# Patient Record
Sex: Female | Born: 2000 | Race: Black or African American | Hispanic: No | Marital: Single | Smoking: Never smoker
Health system: Southern US, Community
[De-identification: ages and names within clinical notes are randomized; demographics above are authoritative.]

## PROBLEM LIST (undated history)

## (undated) DIAGNOSIS — E119 Type 2 diabetes mellitus without complications: Secondary | ICD-10-CM

---
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
## 2019-05-07 ENCOUNTER — Ambulatory Visit: Payer: PRIVATE HEALTH INSURANCE | Attending: Family

## 2019-05-07 DIAGNOSIS — Z23 Encounter for immunization: Secondary | ICD-10-CM

## 2019-05-07 NOTE — Progress Notes (Signed)
   Covid-19 Vaccination Clinic  Name:  Susan [name]    MRN: [id number] DOB: 05/17/2000  05/07/2019  [name] was observed post Covid-19 immunization for 15 minutes without incident. She was provided with Vaccine Information Sheet and instruction to access the V-Safe system.   [name] was instructed to call 911 with any severe reactions post vaccine: Marland Kitchen Difficulty breathing  . Swelling of face and throat  . A fast heartbeat  . A bad rash all over body  . Dizziness and weakness   Immunizations Administered    Name Date Dose VIS Date Route   Moderna COVID-19 Vaccine 05/07/2019  3:51 PM 0.5 mL 01/20/2019 Intramuscular   Manufacturer: Moderna   Lot: [id number]   NDC: 15183-437-35

## 2019-06-09 ENCOUNTER — Ambulatory Visit: Payer: PRIVATE HEALTH INSURANCE | Attending: Family

## 2019-06-09 DIAGNOSIS — Z23 Encounter for immunization: Secondary | ICD-10-CM

## 2019-06-09 NOTE — Progress Notes (Signed)
   Covid-19 Vaccination Clinic  Name:  Susan [name]    MRN: [id number] DOB: 2001/02/03  06/09/2019  [name] was observed post Covid-19 immunization for 15 minutes without incident. She was provided with Vaccine Information Sheet and instruction to access the V-Safe system.   [name] was instructed to call 911 with any severe reactions post vaccine: Marland Kitchen Difficulty breathing  . Swelling of face and throat  . A fast heartbeat  . A bad rash all over body  . Dizziness and weakness   Immunizations Administered    Name Date Dose VIS Date Route   Moderna COVID-19 Vaccine 06/09/2019  4:07 PM 0.5 mL 01/2019 Intramuscular   Manufacturer: Moderna   Lot: [id number]   NDC: 17356-701-41

## 2020-04-18 ENCOUNTER — Encounter (HOSPITAL_COMMUNITY): Payer: Self-pay

## 2020-04-18 ENCOUNTER — Observation Stay (HOSPITAL_COMMUNITY)
Admission: EM | Admit: 2020-04-18 | Discharge: 2020-04-19 | Disposition: A | Discharge status: Home / Self Care | Payer: Medicaid - Out of State | Attending: Internal Medicine | Admitting: Internal Medicine

## 2020-04-18 ENCOUNTER — Observation Stay (HOSPITAL_COMMUNITY): Payer: Medicaid - Out of State

## 2020-04-18 ENCOUNTER — Other Ambulatory Visit: Payer: Self-pay

## 2020-04-18 DIAGNOSIS — E871 Hypo-osmolality and hyponatremia: Secondary | ICD-10-CM

## 2020-04-18 DIAGNOSIS — E1010 Type 1 diabetes mellitus with ketoacidosis without coma: Principal | ICD-10-CM | POA: Insufficient documentation

## 2020-04-18 DIAGNOSIS — E876 Hypokalemia: Secondary | ICD-10-CM | POA: Diagnosis present

## 2020-04-18 DIAGNOSIS — E1110 Type 2 diabetes mellitus with ketoacidosis without coma: Secondary | ICD-10-CM

## 2020-04-18 DIAGNOSIS — R631 Polydipsia: Secondary | ICD-10-CM | POA: Diagnosis present

## 2020-04-18 DIAGNOSIS — Z20822 Contact with and (suspected) exposure to covid-19: Secondary | ICD-10-CM | POA: Diagnosis not present

## 2020-04-18 DIAGNOSIS — R0602 Shortness of breath: Secondary | ICD-10-CM

## 2020-04-18 DIAGNOSIS — Z794 Long term (current) use of insulin: Secondary | ICD-10-CM | POA: Insufficient documentation

## 2020-04-18 DIAGNOSIS — E1310 Other specified diabetes mellitus with ketoacidosis without coma: Secondary | ICD-10-CM

## 2020-04-18 HISTORY — DX: Type 2 diabetes mellitus with ketoacidosis without coma: E11.10

## 2020-04-18 LAB — BASIC METABOLIC PANEL
Anion gap: 20 — ABNORMAL HIGH (ref 5–15)
Anion gap: 17 — ABNORMAL HIGH (ref 5–15)
Anion gap: 14 (ref 5–15)
BUN: 9 mg/dL (ref 6–20)
BUN: 9 mg/dL (ref 6–20)
BUN: 6 mg/dL (ref 6–20)
CO2: 10 mmol/L — ABNORMAL LOW (ref 22–32)
CO2: 10 mmol/L — ABNORMAL LOW (ref 22–32)
CO2: 13 mmol/L — ABNORMAL LOW (ref 22–32)
Calcium: 9.4 mg/dL (ref 8.9–10.3)
Calcium: 8.4 mg/dL — ABNORMAL LOW (ref 8.9–10.3)
Calcium: 8.8 mg/dL — ABNORMAL LOW (ref 8.9–10.3)
Chloride: 99 mmol/L (ref 98–111)
Chloride: 102 mmol/L (ref 98–111)
Chloride: 105 mmol/L (ref 98–111)
Creatinine, Ser: 0.91 mg/dL (ref 0.44–1.00)
Creatinine, Ser: 0.96 mg/dL (ref 0.44–1.00)
Creatinine, Ser: 0.73 mg/dL (ref 0.44–1.00)
GFR, Estimated: >60 mL/min (ref >60)
GFR, Estimated: >60 mL/min (ref >60)
GFR, Estimated: >60 mL/min (ref >60)
Glucose, Bld: 329 mg/dL — ABNORMAL HIGH (ref 70–99)
Glucose, Bld: 269 mg/dL — ABNORMAL HIGH (ref 70–99)
Glucose, Bld: 141 mg/dL — ABNORMAL HIGH (ref 70–99)
Potassium: 3.1 mmol/L — ABNORMAL LOW (ref 3.5–5.1)
Potassium: 3.5 mmol/L (ref 3.5–5.1)
Potassium: 2.9 mmol/L — ABNORMAL LOW (ref 3.5–5.1)
Sodium: 129 mmol/L — ABNORMAL LOW (ref 135–145)
Sodium: 129 mmol/L — ABNORMAL LOW (ref 135–145)
Sodium: 132 mmol/L — ABNORMAL LOW (ref 135–145)

## 2020-04-18 LAB — CBC
HCT: 43.1 % (ref 36.0–46.0)
Hemoglobin: 14.5 g/dL (ref 12.0–15.0)
MCH: 29.7 pg (ref 26.0–34.0)
MCHC: 33.6 g/dL (ref 30.0–36.0)
MCV: 88.1 fL (ref 80.0–100.0)
Platelets: 270 10*3/uL (ref 150–400)
RBC: 4.89 MIL/uL (ref 3.87–5.11)
RDW: 12.6 % (ref 11.5–15.5)
WBC: 5.4 10*3/uL (ref 4.0–10.5)
nRBC: 0 % (ref 0.0–0.2)

## 2020-04-18 LAB — MRSA PCR SCREENING: MRSA by PCR: NEGATIVE

## 2020-04-18 LAB — RESP PANEL BY RT-PCR (FLU A&B, COVID) ARPGX2
Influenza A by PCR: NEGATIVE
Influenza B by PCR: NEGATIVE
SARS Coronavirus 2 by RT PCR: NEGATIVE

## 2020-04-18 LAB — GLUCOSE, CAPILLARY
Glucose-Capillary: 162 mg/dL — ABNORMAL HIGH (ref 70–99)
Glucose-Capillary: 139 mg/dL — ABNORMAL HIGH (ref 70–99)
Glucose-Capillary: 151 mg/dL — ABNORMAL HIGH (ref 70–99)
Glucose-Capillary: 154 mg/dL — ABNORMAL HIGH (ref 70–99)

## 2020-04-18 LAB — BLOOD GAS, VENOUS
Acid-base deficit: 17.1 mmol/L — ABNORMAL HIGH (ref 0.0–2.0)
Bicarbonate: 9.7 mmol/L — ABNORMAL LOW (ref 20.0–28.0)
O2 Saturation: 79.8 %
Patient temperature: 98.6
pCO2, Ven: 25.9 mmHg — ABNORMAL LOW (ref 44.0–60.0)
pH, Ven: 7.197 — CL (ref 7.250–7.430)
pO2, Ven: 48.9 mmHg — ABNORMAL HIGH (ref 32.0–45.0)

## 2020-04-18 LAB — CBC WITH DIFFERENTIAL/PLATELET
Abs Immature Granulocytes: 0.07 10*3/uL (ref 0.00–0.07)
Basophils Absolute: 0 10*3/uL (ref 0.0–0.1)
Basophils Relative: 1 %
Eosinophils Absolute: 0 10*3/uL (ref 0.0–0.5)
Eosinophils Relative: 0 %
HCT: 40.5 % (ref 36.0–46.0)
Hemoglobin: 13.6 g/dL (ref 12.0–15.0)
Immature Granulocytes: 1 %
Lymphocytes Relative: 36 %
Lymphs Abs: 1.8 10*3/uL (ref 0.7–4.0)
MCH: 30 pg (ref 26.0–34.0)
MCHC: 33.6 g/dL (ref 30.0–36.0)
MCV: 89.4 fL (ref 80.0–100.0)
Monocytes Absolute: 0.5 10*3/uL (ref 0.1–1.0)
Monocytes Relative: 11 %
Neutro Abs: 2.5 10*3/uL (ref 1.7–7.7)
Neutrophils Relative %: 51 %
Platelets: 209 10*3/uL (ref 150–400)
RBC: 4.53 MIL/uL (ref 3.87–5.11)
RDW: 12.5 % (ref 11.5–15.5)
WBC: 4.9 10*3/uL (ref 4.0–10.5)
nRBC: 0 % (ref 0.0–0.2)

## 2020-04-18 LAB — URINALYSIS, ROUTINE W REFLEX MICROSCOPIC
Bilirubin Urine: NEGATIVE
Glucose, UA: >=500 mg/dL — AB
Hgb urine dipstick: NEGATIVE
Ketones, ur: 80 mg/dL — AB
Leukocytes,Ua: NEGATIVE
Nitrite: NEGATIVE
Protein, ur: 100 mg/dL — AB
Specific Gravity, Urine: 1.03 (ref 1.005–1.030)
pH: 5 (ref 5.0–8.0)

## 2020-04-18 LAB — HEMOGLOBIN A1C
Hgb A1c MFr Bld: 10.3 % — ABNORMAL HIGH (ref 4.8–5.6)
Mean Plasma Glucose: 248.91 mg/dL

## 2020-04-18 LAB — GROUP A STREP BY PCR: Group A Strep by PCR: NOT DETECTED

## 2020-04-18 LAB — CBG MONITORING, ED
Glucose-Capillary: 350 mg/dL — ABNORMAL HIGH (ref 70–99)
Glucose-Capillary: 243 mg/dL — ABNORMAL HIGH (ref 70–99)

## 2020-04-18 LAB — BETA-HYDROXYBUTYRIC ACID
Beta-Hydroxybutyric Acid: 7.23 mmol/L — ABNORMAL HIGH (ref 0.05–0.27)
Beta-Hydroxybutyric Acid: 6.03 mmol/L — ABNORMAL HIGH (ref 0.05–0.27)

## 2020-04-18 LAB — HIV ANTIBODY (ROUTINE TESTING W REFLEX): HIV Screen 4th Generation wRfx: NONREACTIVE

## 2020-04-18 LAB — I-STAT BETA HCG BLOOD, ED (MC, WL, AP ONLY): I-stat hCG, quantitative: <5 m[IU]/mL (ref <5)

## 2020-04-18 LAB — MAGNESIUM: Magnesium: 1.9 mg/dL (ref 1.7–2.4)

## 2020-04-18 MED ORDER — DEXTROSE IN LACTATED RINGERS 5 % IV SOLN: INTRAVENOUS | Status: DC

## 2020-04-18 MED ORDER — LACTATED RINGERS IV BOLUS
20.0000 mL/kg | Freq: Once | INTRAVENOUS | Status: AC
  Administered 2020-04-18: 1878 mL via INTRAVENOUS

## 2020-04-18 MED ORDER — CHLORHEXIDINE GLUCONATE CLOTH 2 % EX PADS
6.0000 | MEDICATED_PAD | Freq: Every day | CUTANEOUS | Status: DC
  Administered 2020-04-18: 6 via TOPICAL

## 2020-04-18 MED ORDER — LACTATED RINGERS IV SOLN: INTRAVENOUS | Status: DC

## 2020-04-18 MED ORDER — SODIUM CHLORIDE 0.9 % IV BOLUS
1000.0000 mL | Freq: Once | INTRAVENOUS | Status: AC
  Administered 2020-04-18: 1000 mL via INTRAVENOUS

## 2020-04-18 MED ORDER — DEXTROSE 50 % IV SOLN: 0.0000 mL | INTRAVENOUS | Status: DC | PRN

## 2020-04-18 MED ORDER — POTASSIUM CHLORIDE 10 MEQ/100ML IV SOLN
10.0000 meq | INTRAVENOUS | Status: AC
  Administered 2020-04-18 (×3): 10 meq via INTRAVENOUS
  Filled 2020-04-18 (×3): qty 100

## 2020-04-18 MED ORDER — INSULIN REGULAR(HUMAN) IN NACL 100-0.9 UT/100ML-% IV SOLN
INTRAVENOUS | Status: DC
  Administered 2020-04-18: 13 [IU]/h via INTRAVENOUS
  Filled 2020-04-18: qty 100

## 2020-04-18 NOTE — ED Provider Notes (Signed)
[facility] DEPT Provider Note   CSN: [id number] Arrival date & time: 04/18/20  1348     History Chief Complaint  Patient presents with  . Shortness of Breath  . Urinary Frequency    Susan [name] is a 20 y.o. female.  20 year old female with history of diabetes (not currently on medications) presents with complaint of increased thirst/dry mucous membranes, generalized weakness for the past few days. Patient states her blood sugar has been high, went to urgent care today and was sent to the ER for elevated blood glucose and ketones in her urine. History of prior DKA admission a few years ago. Denies vomiting, fevers, changes in bowel or bladder habits. No other complaints or concerns.         History reviewed. No pertinent past medical history.  Patient Active Problem List   Diagnosis Date Noted  . DKA (diabetic ketoacidosis) ([place]) 04/18/2020  . Hyponatremia 04/18/2020  . Hypokalemia 12/05/2018  . Hypophosphatemia 12/05/2018  . DKA (diabetic ketoacidoses) 12/03/2018    History reviewed. No pertinent surgical history.   OB History   No obstetric history on file.     Family History  Problem Relation Age of Onset  . Diabetes Mother   . Diabetes Father     Social History   Tobacco Use  . Smoking status: Never Smoker  . Smokeless tobacco: Never Used  Vaping Use  . Vaping Use: Never used  Substance Use Topics  . Alcohol use: Never  . Drug use: Never    Home Medications Prior to Admission medications   Medication Sig Start Date End Date Taking? Authorizing Provider  blood glucose meter kit and supplies Dispense based on patient and insurance preference. Use up to four times daily as directed. (FOR ICD-10 E10.9, E11.9). 12/07/18   Antonieta Pert, MD  insulin aspart (NOVOLOG FLEXPEN) 100 UNIT/ML FlexPen Inject 10 Units into the skin 3 (three) times daily with meals. 12/07/18 01/06/19  Antonieta Pert, MD  Insulin Glargine (LANTUS SOLOSTAR)  100 UNIT/ML Solostar Pen Inject 15 Units into the skin 2 (two) times daily. 12/07/18 01/06/19  Antonieta Pert, MD  Insulin Pen Needle (PEN NEEDLES 3/16") 31G X 5 MM MISC 1 each by Does not apply route 5 (five) times daily. 12/07/18   Antonieta Pert, MD  loratadine (CLARITIN) 10 MG tablet Take 10 mg by mouth daily.    [provider]  norethindrone-ethinyl estradiol (LOESTRIN FE) 1-20 MG-MCG tablet Take 1 tablet by mouth daily.    [provider]  potassium chloride (KLOR-CON) 10 MEQ tablet Take 2 tablets (20 mEq total) by mouth 2 (two) times daily for 7 days. 12/07/18 12/14/18  Antonieta Pert, MD    Allergies    Patient has no known allergies.  Review of Systems   Review of Systems  Constitutional: Negative for chills and fever.  HENT: Negative for sore throat.   Respiratory: Negative for shortness of breath.   Cardiovascular: Negative for chest pain.  Gastrointestinal: Positive for nausea. Negative for abdominal pain, constipation, diarrhea and vomiting.  Endocrine: Positive for polydipsia. Negative for polyphagia and polyuria.  Genitourinary: Negative for dysuria and frequency.  Musculoskeletal: Negative for arthralgias and myalgias.  Skin: Negative for rash and wound.  Allergic/Immunologic: Positive for immunocompromised state.  Neurological: Positive for weakness.  Hematological: Negative for adenopathy.  All other systems reviewed and are negative.   Physical Exam Updated Vital Signs BP (!) 136/99 (BP Location: Left Arm)   Pulse (!) 115  Temp 98.2 F (36.8 C) (Oral)   Resp 18   Ht '5\' 8"'  (1.727 m)   Wt 93.9 kg   LMP 04/04/2020   SpO2 98%   BMI 31.47 kg/m   Physical Exam Vitals and nursing note reviewed.  Constitutional:      General: She is not in acute distress.    Appearance: She is well-developed and well-nourished. She is not diaphoretic.  HENT:     Head: Normocephalic and atraumatic.     Mouth/Throat:     Pharynx: Oropharyngeal exudate present. No  pharyngeal swelling.  Eyes:     Pupils: Pupils are equal, round, and reactive to light.  Cardiovascular:     Rate and Rhythm: Regular rhythm. Tachycardia present.  Pulmonary:     Effort: Pulmonary effort is normal. No respiratory distress.     Breath sounds: Normal breath sounds. No decreased breath sounds.  Musculoskeletal:     Cervical back: Neck supple.     Right lower leg: No edema.     Left lower leg: No edema.  Skin:    General: Skin is warm and dry.     Findings: No erythema or rash.  Neurological:     Mental Status: She is alert and oriented to person, place, and time.  Psychiatric:        Mood and Affect: Mood and affect normal.        Behavior: Behavior normal.     ED Results / Procedures / Treatments   Labs (all labs ordered are listed, but only abnormal results are displayed) Labs Reviewed  BASIC METABOLIC PANEL - Abnormal; Notable for the following components:      Result Value   Sodium 129 (*)    Potassium 3.1 (*)    CO2 10 (*)    Glucose, Bld 329 (*)    Anion gap 20 (*)    All other components within normal limits  CBG MONITORING, ED - Abnormal; Notable for the following components:   Glucose-Capillary 350 (*)    All other components within normal limits  GROUP A STREP BY PCR  RESP PANEL BY RT-PCR (FLU A&B, COVID) ARPGX2  CBC  URINALYSIS, ROUTINE W REFLEX MICROSCOPIC  BASIC METABOLIC PANEL  BASIC METABOLIC PANEL  BASIC METABOLIC PANEL  BASIC METABOLIC PANEL  BETA-HYDROXYBUTYRIC ACID  BETA-HYDROXYBUTYRIC ACID  CBC WITH DIFFERENTIAL/PLATELET  BLOOD GAS, VENOUS  MAGNESIUM  HIV ANTIBODY (ROUTINE TESTING W REFLEX)  HEMOGLOBIN M1D  BASIC METABOLIC PANEL  BASIC METABOLIC PANEL  BASIC METABOLIC PANEL  BASIC METABOLIC PANEL  BETA-HYDROXYBUTYRIC ACID  BETA-HYDROXYBUTYRIC ACID  I-STAT BETA HCG BLOOD, ED ([facility], [facility], [facility] ONLY)    EKG None  Radiology No results found.  Procedures .Critical Care Performed by: Tacy Learn, PA-C Authorized by:  Tacy Learn, PA-C   Critical care provider statement:    Critical care time (minutes):  45   Critical care was time spent personally by me on the following activities:  Discussions with consultants, evaluation of patient's response to treatment, examination of patient, ordering and performing treatments and interventions, ordering and review of laboratory studies, ordering and review of radiographic studies, pulse oximetry, re-evaluation of patient's condition, obtaining history from patient or surrogate and review of old charts     Medications Ordered in ED Medications  insulin regular, human (MYXREDLIN) 100 units/ 100 mL infusion (has no administration in time range)  lactated ringers infusion (has no administration in time range)  dextrose 5 % in lactated ringers infusion (0 mLs  Intravenous Hold 04/18/20 1625)  dextrose 50 % solution 0-50 mL (has no administration in time range)  potassium chloride 10 mEq in 100 mL IVPB (10 mEq Intravenous New Bag/Given 04/18/20 1624)  sodium chloride 0.9 % bolus 1,000 mL (1,000 mLs Intravenous New Bag/Given 04/18/20 1545)  lactated ringers bolus 1,878 mL (1,878 mLs Intravenous New Bag/Given 04/18/20 1623)    ED Course  I have reviewed the triage vital signs and the nursing notes.  Pertinent labs & imaging results that were available during my care of the patient were reviewed by me and considered in my medical decision making (see chart for details).  Clinical Course as of 04/18/20 1646  Mon Apr 18, 5344  284 20 year old female with report of dry mucous membranes and generalized weakness. Denies sore throat but reports white coating on her tonsils and told she should have a strep test. DKA admission 11/2018, states she was later told she did not need medication and periodically measures her blood sugar.   On exam, patient is well appearing, pleasant, does have dry mucous membranes. Tachycardic in the one-teens.  BMP with K 3.1, glucose 329, bicarb  10 and gap 20. DKA orders initiated with K correction.  Hospitalist paged for admission. Discussed with Dr. Tomi Bamberger, ER attending.  [LM]  4847 Case discussed with Dr. Neysa Bonito with [facility] who will consult for admission.  [LM]    Clinical Course User Index [LM] Roque Lias   MDM Rules/Calculators/A&P                          Final Clinical Impression(s) / ED Diagnoses Final diagnoses:  Diabetic ketoacidosis without coma associated with other specified diabetes mellitus [facility])    Rx / DC Orders ED Discharge Orders    None       Tacy Learn, PA-C 04/18/20 1647    Dorie Rank, MD 04/19/20 [phone]

## 2020-04-18 NOTE — ED Triage Notes (Signed)
Patient c/o SOB since last week and urinary frequency x 2 weeks.  CBG in triage was 350 and patient states she has a white coating in her throat as well.

## 2020-04-18 NOTE — H&P (Signed)
History and Physical        Hospital Admission Note Date: 04/18/2020  Patient name: Susan [name] Medical record number: [id number] Date of birth: December 21, 2000 Age: 20 y.o. Gender: female  PCP: System, Provider Not In  Chief Complaint    Chief Complaint  Patient presents with  . Shortness of Breath  . Urinary Frequency      HPI:   This is a 20 year old female who has been vaccinated against COVID 19 x3 with past medical history of diabetes suspected type I, who presented to the ED with complaints of increased thirst, dry mucous membranes, generalized weakness, shortness of breath for the past two weeks which had gradually worsened. She noticed that her glucose was higher also up to 190s. She was hospitalized in 2020 for DKA and newly diagnosed diabetes and was seen by an endocrinologist out of state (goes to school in [place]) and was told that she is a prediabetic and did not need insulin. She has not been on insulin for at least the past year. Started feeling ill two weeks ago. She went to an urgent care today and was sent to the ER for elevated blood sugar and ketones in her urine.    ED Course: Afebrile, tachycardic, hemodynamically stable, on room air. Notable Labs: Sodium 129, K3.1, CO2 10, glucose 329, BUN 9, creatinine 0.91, AG 20, WBC 5.4, Hb 14.5, platelets 270, COVID-19 pending. Patient received 1 L NS bolus and is to start on insulin drip and fluids per DKA protocol..    Vitals:   04/18/20 1355 04/18/20 1647  BP: (!) 136/99 (!) 138/98  Pulse: (!) 115 93  Resp: 18 15  Temp: 98.2 F (36.8 C)   SpO2: 98% 100%     Review of Systems:  Review of Systems  HENT: Negative for congestion.   Respiratory: Negative for cough.   All other systems reviewed and are negative.   Medical/Social/Family History   Past Medical History: History reviewed. No pertinent past medical  history.  History reviewed. No pertinent surgical history.  Medications: Prior to Admission medications   Medication Sig Start Date End Date Taking? Authorizing Provider  blood glucose meter kit and supplies Dispense based on patient and insurance preference. Use up to four times daily as directed. (FOR ICD-10 E10.9, E11.9). 12/07/18   Antonieta Pert, MD  insulin aspart (NOVOLOG FLEXPEN) 100 UNIT/ML FlexPen Inject 10 Units into the skin 3 (three) times daily with meals. 12/07/18 01/06/19  Antonieta Pert, MD  Insulin Glargine (LANTUS SOLOSTAR) 100 UNIT/ML Solostar Pen Inject 15 Units into the skin 2 (two) times daily. 12/07/18 01/06/19  Antonieta Pert, MD  Insulin Pen Needle (PEN NEEDLES 3/16") 31G X 5 MM MISC 1 each by Does not apply route 5 (five) times daily. 12/07/18   Antonieta Pert, MD  loratadine (CLARITIN) 10 MG tablet Take 10 mg by mouth daily.    [provider]  norethindrone-ethinyl estradiol (LOESTRIN FE) 1-20 MG-MCG tablet Take 1 tablet by mouth daily.    [provider]  potassium chloride (KLOR-CON) 10 MEQ tablet Take 2 tablets (20 mEq total) by mouth 2 (two) times daily for 7 days. 12/07/18 12/14/18  Antonieta Pert, MD    Allergies:  No  Known Allergies  Social History:  reports that she has never smoked. She has never used smokeless tobacco. She reports that she does not drink alcohol and does not use drugs.  Family History: Family History  Problem Relation Age of Onset  . Diabetes Mother   . Diabetes Father      Objective   Physical Exam: Blood pressure (!) 138/98, pulse 93, temperature 98.2 F (36.8 C), temperature source Oral, resp. rate 15, height '5\' 8"'  (1.727 m), weight 93.9 kg, last menstrual period 04/04/2020, SpO2 100 %.  Physical Exam Vitals and nursing note reviewed.  Constitutional:      Appearance: Normal appearance.  HENT:     Head: Normocephalic and atraumatic.  Eyes:     Conjunctiva/sclera: Conjunctivae normal.  Cardiovascular:     Rate and  Rhythm: Normal rate and regular rhythm.  Pulmonary:     Effort: Pulmonary effort is normal.     Breath sounds: Normal breath sounds.  Abdominal:     General: Abdomen is flat.     Palpations: Abdomen is soft.  Musculoskeletal:        General: No swelling or tenderness.  Skin:    Coloration: Skin is not jaundiced or pale.  Neurological:     Mental Status: She is alert. Mental status is at baseline.  Psychiatric:        Mood and Affect: Mood normal.        Behavior: Behavior normal.     LABS on Admission: I have personally reviewed all the labs and imaging below    Basic Metabolic Panel: Recent Labs  Lab 04/18/20 1448  NA 129*  K 3.1*  CL 99  CO2 10*  GLUCOSE 329*  BUN 9  CREATININE 0.91  CALCIUM 9.4   Liver Function Tests: No results for input(s): AST, ALT, ALKPHOS, BILITOT, PROT, ALBUMIN in the last 168 hours. No results for input(s): LIPASE, AMYLASE in the last 168 hours. No results for input(s): AMMONIA in the last 168 hours. CBC: Recent Labs  Lab 04/18/20 1448  WBC 5.4  HGB 14.5  HCT 43.1  MCV 88.1  PLT 270   Cardiac Enzymes: No results for input(s): CKTOTAL, CKMB, CKMBINDEX, TROPONINI in the last 168 hours. BNP: Invalid input(s): POCBNP CBG: Recent Labs  Lab 04/18/20 1358  GLUCAP 350*    Radiological Exams on Admission:  No results found.    EKG: Not done   A & P   Principal Problem:   DKA (diabetic ketoacidosis) (HCC) Active Problems:   Hypokalemia   Hyponatremia   1. DKA a. Glucose 329, CO2 10, AG 20 b. Insulin and IV fluids per DKA order set c. Check ketones d. HbA1c e. N.p.o. f. Diabetic coordinator consult  2. Shortness of breath a. CXR b. Follow up COVID 19 screen  3. Hyponatremia a. Sodium corrects to 133  4. Hypokalemia a. Replace per DKA order set    DVT prophylaxis: SCD   Code Status: Full Code  Diet: NPO Family Communication: Admission, patients condition and plan of care including tests being ordered  have been discussed with the patient who indicates understanding and agrees with the plan and Code Status.  Disposition Plan: The appropriate patient status for this patient is OBSERVATION. Observation status is judged to be reasonable and necessary in order to provide the required intensity of service to ensure the patient's safety. The patient's presenting symptoms, physical exam findings, and initial radiographic and laboratory data in the context of their medical condition is felt  to place them at decreased risk for further clinical deterioration. Furthermore, it is anticipated that the patient will be medically stable for discharge from the hospital within 2 midnights of admission. The following factors support the patient status of observation.   " The patient's presenting symptoms include generalized weakness, shortness of breath. " The physical exam findings include unremarkable. " The initial radiographic and laboratory data show DKA.    Consultants  . none  Procedures  . none  Time Spent on Admission: 53 minutes    Harold Hedge, DO [facility]  04/18/2020, 4:51 PM

## 2020-04-19 DIAGNOSIS — E871 Hypo-osmolality and hyponatremia: Secondary | ICD-10-CM | POA: Diagnosis not present

## 2020-04-19 DIAGNOSIS — E876 Hypokalemia: Secondary | ICD-10-CM | POA: Diagnosis not present

## 2020-04-19 DIAGNOSIS — E1010 Type 1 diabetes mellitus with ketoacidosis without coma: Secondary | ICD-10-CM | POA: Diagnosis not present

## 2020-04-19 LAB — BASIC METABOLIC PANEL
Anion gap: 11 (ref 5–15)
Anion gap: 11 (ref 5–15)
Anion gap: 15 (ref 5–15)
BUN: <5 mg/dL — ABNORMAL LOW (ref 6–20)
BUN: 5 mg/dL — ABNORMAL LOW (ref 6–20)
BUN: 7 mg/dL (ref 6–20)
CO2: 16 mmol/L — ABNORMAL LOW (ref 22–32)
CO2: 17 mmol/L — ABNORMAL LOW (ref 22–32)
CO2: 17 mmol/L — ABNORMAL LOW (ref 22–32)
Calcium: 9.2 mg/dL (ref 8.9–10.3)
Calcium: 9.2 mg/dL (ref 8.9–10.3)
Calcium: 8.6 mg/dL — ABNORMAL LOW (ref 8.9–10.3)
Chloride: 107 mmol/L (ref 98–111)
Chloride: 106 mmol/L (ref 98–111)
Chloride: 100 mmol/L (ref 98–111)
Creatinine, Ser: 0.73 mg/dL (ref 0.44–1.00)
Creatinine, Ser: 0.75 mg/dL (ref 0.44–1.00)
Creatinine, Ser: 0.84 mg/dL (ref 0.44–1.00)
GFR, Estimated: >60 mL/min (ref >60)
GFR, Estimated: >60 mL/min (ref >60)
GFR, Estimated: >60 mL/min (ref >60)
Glucose, Bld: 119 mg/dL — ABNORMAL HIGH (ref 70–99)
Glucose, Bld: 137 mg/dL — ABNORMAL HIGH (ref 70–99)
Glucose, Bld: 286 mg/dL — ABNORMAL HIGH (ref 70–99)
Potassium: 2.9 mmol/L — ABNORMAL LOW (ref 3.5–5.1)
Potassium: 2.9 mmol/L — ABNORMAL LOW (ref 3.5–5.1)
Potassium: 3.1 mmol/L — ABNORMAL LOW (ref 3.5–5.1)
Sodium: 134 mmol/L — ABNORMAL LOW (ref 135–145)
Sodium: 134 mmol/L — ABNORMAL LOW (ref 135–145)
Sodium: 132 mmol/L — ABNORMAL LOW (ref 135–145)

## 2020-04-19 LAB — GLUCOSE, CAPILLARY
Glucose-Capillary: 126 mg/dL — ABNORMAL HIGH (ref 70–99)
Glucose-Capillary: 126 mg/dL — ABNORMAL HIGH (ref 70–99)
Glucose-Capillary: 129 mg/dL — ABNORMAL HIGH (ref 70–99)
Glucose-Capillary: 144 mg/dL — ABNORMAL HIGH (ref 70–99)
Glucose-Capillary: 145 mg/dL — ABNORMAL HIGH (ref 70–99)
Glucose-Capillary: 201 mg/dL — ABNORMAL HIGH (ref 70–99)
Glucose-Capillary: 246 mg/dL — ABNORMAL HIGH (ref 70–99)
Glucose-Capillary: 254 mg/dL — ABNORMAL HIGH (ref 70–99)

## 2020-04-19 LAB — CBC
HCT: 38.3 % (ref 36.0–46.0)
Hemoglobin: 13 g/dL (ref 12.0–15.0)
MCH: 29.9 pg (ref 26.0–34.0)
MCHC: 33.9 g/dL (ref 30.0–36.0)
MCV: 88 fL (ref 80.0–100.0)
Platelets: 238 10*3/uL (ref 150–400)
RBC: 4.35 MIL/uL (ref 3.87–5.11)
RDW: 12.8 % (ref 11.5–15.5)
WBC: 4.7 10*3/uL (ref 4.0–10.5)
nRBC: 0 % (ref 0.0–0.2)

## 2020-04-19 LAB — MAGNESIUM: Magnesium: 2.4 mg/dL (ref 1.7–2.4)

## 2020-04-19 LAB — BETA-HYDROXYBUTYRIC ACID
Beta-Hydroxybutyric Acid: 3.49 mmol/L — ABNORMAL HIGH (ref 0.05–0.27)
Beta-Hydroxybutyric Acid: 2.64 mmol/L — ABNORMAL HIGH (ref 0.05–0.27)

## 2020-04-19 MED ORDER — INSULIN ASPART 100 UNIT/ML FLEXPEN: PEN_INJECTOR | SUBCUTANEOUS | 0 refills | Status: DC

## 2020-04-19 MED ORDER — POTASSIUM CHLORIDE CRYS ER 20 MEQ PO TBCR
40.0000 meq | EXTENDED_RELEASE_TABLET | Freq: Once | ORAL | Status: AC
  Administered 2020-04-19: 40 meq via ORAL
  Filled 2020-04-19: qty 2

## 2020-04-19 MED ORDER — INSULIN ASPART 100 UNIT/ML ~~LOC~~ SOLN
3.0000 [IU] | Freq: Three times a day (TID) | SUBCUTANEOUS | Status: DC
  Administered 2020-04-19: 3 [IU] via SUBCUTANEOUS

## 2020-04-19 MED ORDER — INSULIN GLARGINE 100 UNIT/ML ~~LOC~~ SOLN
5.0000 [IU] | Freq: Once | SUBCUTANEOUS | Status: AC
  Administered 2020-04-19: 5 [IU] via SUBCUTANEOUS
  Filled 2020-04-19: qty 0.05

## 2020-04-19 MED ORDER — INSULIN ASPART 100 UNIT/ML ~~LOC~~ SOLN: 0.0000 [IU] | Freq: Every day | SUBCUTANEOUS | Status: DC

## 2020-04-19 MED ORDER — PEN NEEDLES 3/16" 31G X 5 MM MISC
1.0000 | Freq: Three times a day (TID) | 0 refills | Status: DC
Start: 2020-04-19 — End: 2020-06-09

## 2020-04-19 MED ORDER — INSULIN ASPART 100 UNIT/ML ~~LOC~~ SOLN
0.0000 [IU] | Freq: Three times a day (TID) | SUBCUTANEOUS | Status: DC
  Administered 2020-04-19 (×2): 3 [IU] via SUBCUTANEOUS
  Administered 2020-04-19: 5 [IU] via SUBCUTANEOUS

## 2020-04-19 MED ORDER — LACTATED RINGERS IV SOLN: INTRAVENOUS | Status: DC

## 2020-04-19 MED ORDER — INSULIN ASPART 100 UNIT/ML FLEXPEN: 5.0000 [IU] | PEN_INJECTOR | Freq: Three times a day (TID) | SUBCUTANEOUS | 0 refills | Status: DC

## 2020-04-19 MED ORDER — MAGNESIUM SULFATE 2 GM/50ML IV SOLN
2.0000 g | Freq: Once | INTRAVENOUS | Status: AC
  Administered 2020-04-19: 2 g via INTRAVENOUS
  Filled 2020-04-19: qty 50

## 2020-04-19 MED ORDER — POTASSIUM CHLORIDE 2 MEQ/ML IV SOLN: INTRAVENOUS | Status: DC

## 2020-04-19 MED ORDER — POTASSIUM CHLORIDE 10 MEQ/100ML IV SOLN
10.0000 meq | INTRAVENOUS | Status: AC
  Administered 2020-04-19 (×2): 10 meq via INTRAVENOUS
  Filled 2020-04-19 (×2): qty 100

## 2020-04-19 MED ORDER — INSULIN GLARGINE 100 UNIT/ML SOLOSTAR PEN: 15.0000 [IU] | PEN_INJECTOR | Freq: Every day | SUBCUTANEOUS | 0 refills | Status: DC

## 2020-04-19 MED ORDER — INSULIN ASPART 100 UNIT/ML ~~LOC~~ SOLN
5.0000 [IU] | Freq: Three times a day (TID) | SUBCUTANEOUS | Status: DC
  Administered 2020-04-19 (×2): 5 [IU] via SUBCUTANEOUS

## 2020-04-19 MED ORDER — BLOOD GLUCOSE METER KIT: PACK | 0 refills | Status: DC

## 2020-04-19 MED ORDER — INSULIN GLARGINE 100 UNIT/ML ~~LOC~~ SOLN: 15.0000 [IU] | SUBCUTANEOUS | Status: DC

## 2020-04-19 MED ORDER — INSULIN GLARGINE 100 UNIT/ML ~~LOC~~ SOLN
10.0000 [IU] | SUBCUTANEOUS | Status: DC
  Administered 2020-04-19: 10 [IU] via SUBCUTANEOUS
  Filled 2020-04-19: qty 0.1

## 2020-04-19 NOTE — Discharge Instructions (Signed)
 Diabetic Ketoacidosis Diabetic ketoacidosis (DKA) is a serious complication of diabetes. This condition develops when there is not enough insulin in the body. Insulin is a hormone that regulates blood sugar (glucose) levels in the body. Normally, insulin allows glucose to enter the cells in the body. The cells break down glucose for energy. Without enough insulin, the body cannot break down glucose and breaks down fats instead. This leads to high blood glucose levels in the body. It also leads to the production of acids that are called ketones. Ketones are poisonous at high levels. If diabetic ketoacidosis is not treated, it can cause severe dehydration and can lead to a coma or death. What are the causes? This condition develops when a lack of insulin causes the body to break down fats instead of glucose. This may be triggered by:  Stress on the body. This stress can be brought on by an illness.  Infection.  Medicines that raise blood glucose levels.  Not taking or skipping doses of diabetes medicines.  New onset of type 1 diabetes mellitus.  Missing insulin on purpose or by accident.  Interruption of insulin through an insulin pump. This can happen if the cannula that connects you to the insulin pump gets dislodged or kinked. What are the signs or symptoms? Symptoms of this condition include:  Excessive thirst or dry mouth.  Excessive urination.  Abdominal pain.  Nausea or vomiting.  Vision changes.  Fruity or sweet-smelling breath.  Weight loss.  Irritability or confusion.  Rapid breathing.  High blood glucose.  High levels of ketones in the body. To know your ketone levels: ? Collect urine in a small cup. ? Dip a test strip in the urine. ? Wait for it to change color. ? Compare the test strip results to the chart on the container. How is this diagnosed? This condition is diagnosed based on your medical history, a physical exam, and blood tests. You may also  have a urine test to check for ketones. How is this treated? This condition may be treated with:  Fluid replacement. This may be done with IV fluids to correct dehydration.  Correcting high blood glucose with insulin. This may be given through the skin as injections or through an IV.  Electrolyte replacement. Electrolytes are minerals in your blood. Electrolytes such as potassium and sodium may be given in pill form or through an IV.  Antibiotic medicines. These may be prescribed if your condition was caused by an infection. Diabetic ketoacidosis is a serious medical condition. You may need emergency treatment in the hospital so that you can be monitored closely. Follow these instructions at home: Medicines  Take over-the-counter and prescription medicines only as told by your health care provider.  Continue to take insulin and other diabetes medicines as told by your health care provider.  If you were prescribed an antibiotic medicine, take it as told by your health care provider. Do not stop taking the antibiotic even if you start to feel better. Eating and drinking  Drink enough fluids to keep your urine pale yellow.  If you are able to eat, follow your usual diet and drink sugar-free liquids such as water, tea, and sugar-free soft drinks. You can also have sugar-free gelatin or ice pops.  If you are not able to eat, drink liquids that contain sugar in small amounts as you are able. Liquids include fruit juice, regular soft drinks, and sherbet.   Checking ketones and blood glucose  Check your urine for   when you are ill and as told by your health care provider. ? If your blood glucose is 240 mg/dL (50.0 mmol/L) or higher, check your urine ketones every 4 hours. If you have moderate or large ketones, call your health care provider.  Check your blood glucose every day, and as often as told by your health care provider. ? If your blood glucose is high, drink plenty of fluids. This  helps to flush out ketones. ? If your blood glucose is above your target for 2 tests in a row, contact your health care provider.   General instructions  Carry a medical alert card or wear medical alert jewelry that shows that you have diabetes.  Exercise only as told by your health care provider. Do not exercise when your blood glucose is high and you have ketones in your urine.  If you get sick, call your health care provider and begin treatment quickly. Your body often needs extra insulin to fight an illness. Check your blood glucose every 4 hours when you are sick.  Keep all follow-up visits as told by your health care provider. This is important. Where to find more information  American Diabetes Association: diabetes.org Contact a health care provider if:  Your blood glucose level is higher than 240 mg/dL (93.8 mmol/L) for 2 days in a row.  You have moderate or large ketones in your urine.  You have a fever.  You cannot eat or drink without vomiting.  You have been vomiting for more than 2 hours.  You continue to have symptoms of diabetic ketoacidosis.  You develop new symptoms. Get help right away if:  Your blood glucose monitor reads high even when you are taking insulin.  You faint.  You have chest pain.  You have trouble breathing.  You have sudden trouble speaking or swallowing.  You have vomiting or diarrhea that gets worse after 3 hours.  You are unable to stay awake.  You have trouble thinking.  You are severely dehydrated. Symptoms of severe dehydration include: ? Extreme thirst. ? Dry mouth. ? Rapid breathing. These symptoms may represent a serious problem that is an emergency. Do not wait to see if the symptoms will go away. Get medical help right away. Call your local emergency services (911 in the [place] Do not drive yourself to the hospital. Summary  Diabetic ketoacidosis is a serious complication of diabetes. This condition develops when there  is not enough insulin in the body.  This condition is diagnosed based on your medical history, a physical exam, and blood tests. You may also have a urine test to check for ketones.  Diabetic ketoacidosis is a serious medical condition. You may need emergency treatment in the hospital to monitor your condition.  Contact your health care provider if your blood glucose is higher than 240 mg/dL for 2 days in a row or if you have moderate or large ketones in your urine. This information is not intended to replace advice given to you by your health care provider. Make sure you discuss any questions you have with your health care provider. Document Revised: 12/31/2018 Document Reviewed: 12/31/2018 Elsevier Patient Education  2021 [employer]   Preventing Diabetic Ketoacidosis Diabetic ketoacidosis (DKA) is a life-threatening complication of diabetes (diabetes mellitus). It develops when there is not enough of a hormone called insulin in the body. If the body does not have enough insulin, it cannot divide (break down) sugar (glucose) into usable cells, so it breaks down fats instead.  This leads to the production of acids (ketones), which can cause the blood to have too much acid in it (acidosis). DKA is a medical emergency that must be treated at a hospital. You may be more likely to develop DKA if you have type 1 diabetes and you take insulin. You can prevent DKA by working closely with your health care provider to manage your diabetes. What nutrition changes can be made?  Follow your meal plan, as directed by your health care provider or diet and nutrition specialist (dietitian).  Eat healthy meals at about the same time every day. Have healthy snacks between meals.  Avoid not eating for long periods of time. Do not skip meals, especially if you are ill.  Avoid regularly eating foods that contain a lot of sugar. Also avoid drinking alcohol. Sugary food and alcohol increase your risk of high  blood glucose (hyperglycemia), which increases your risk for DKA.  Drink enough fluid to keep your urine pale yellow. Dehydration increases your risk for DKA.   What actions can I take to lower my risk? To lower your risk for DKA, manage your diabetes as directed by your health care provider:  Take insulin and other diabetes medicines as directed.  Check your blood glucose every day, as often as directed.  Make a sick day plan in advance with your health care provider. Follow your sick day plan whenever you cannot eat or drink as usual.  Check your urine for ketones as often as directed. ? During times when you are sick, check your ketones every 4-6 hours. ? If you develop symptoms of DKA, check your ketones right away.  If you have ketones in your urine: ? Contact your health care provider right away. ? Do not exercise.  Know the symptoms of DKA so that you can get treatment as soon as possible.  Make sure that people at work, home, and school know how to check your blood glucose, in case you are not able to do it yourself.  Wear a medical alert bracelet or carry a card that says that you have diabetes and lists what medicines you take.   Why are these changes important? Preventing high blood glucose and dehydration helps prevent DKA. You may need to work with your health care provider to adjust your diabetes management plan to lower your risk of developing DKA. DKA can lead to a serious medical emergency that can be life-threatening. Where to find support For more support with preventing DKA:  Talk with your health care provider.  Consider joining a support group. The American Diabetes Association has an online support community at: community.diabetes.org Where to find more information Learn more about preventing DKA from:  American Diabetes Association: diabetes.org  Association of Diabetes Care & Education Specialists: diabeteseducator.org  American Heart Association:  heart.org Contact a health care provider if: You develop symptoms of DKA, such as:  Fatigue.  Weight loss.  Excessive thirst or excessive urination.  Light-headedness or rapid breathing.  Fruity or sweet-smelling breath.  Vision changes, confusion, or irritability.  Pain in the abdomen, nausea, or vomiting.  Feeling warm in your face (flushed). This may or may not include a reddish color coming to your face. If you develop any of these symptoms, do not wait to see if the symptoms will go away. Get medical help right away. Call your local emergency services (911 in the [place] Do not drive yourself to the hospital. Summary  Preventing high blood glucose and dehydration helps   prevent DKA. You may need to work with your health care provider to adjust your diabetes management plan to lower your risk of developing DKA.  Check your urine for ketones as often as directed. You may need to check more often when your blood glucose level is high and when you are ill.  DKA is a medical emergency. Make sure you know the symptoms so that you can get treatment right away. This information is not intended to replace advice given to you by your health care provider. Make sure you discuss any questions you have with your health care provider. Document Revised: 12/09/2019 Document Reviewed: 12/09/2019 Elsevier Patient Education  2021 [employer].  Diabetes Mellitus and Sick Day Management Blood sugar (glucose) can be difficult to control when you are sick. Common illnesses that can cause problems for people with diabetes (diabetes mellitus) include colds, fever, flu (influenza), nausea, vomiting, and diarrhea. These illnesses can cause stress and loss of body fluids (dehydration), and those issues can cause blood glucose levels to increase. Because of this, it is very important to take your insulin and diabetes medicines and eat some form of carbohydrate when you are sick. You should make a plan for  days when you are sick (sick day plan) as part of your diabetes management plan. You and your health care provider should make this plan in advance. The following guidelines are intended to help you manage an illness that lasts for about 24 hours or less. Your health care provider may also give you more specific instructions. How to manage your blood glucose  Check your blood glucose every 2-4 hours, or as often as told by your health care provider.  If you use insulin, take your usual dose. If your blood glucose continues to be too high, you may need to take an additional insulin dose as told by your health care provider.  Know your sick day treatment goals. Your target blood glucose levels may be different when you are sick.  If you use oral diabetes medicine, continue to take your medicines. Have a plan with your health care provider for these medicines while you are sick.  If you use injectable hormone medicines other than insulin to control your diabetes, have a plan with your health care provider for these medicines while you are sick.   Follow these instructions at home Check your ketones  If you have type 1 diabetes, check your urine ketones every 4 hours.  If you have type 2 diabetes, check your urine ketones as often as told by your health care provider. Eating and drinking  Drink enough fluid to keep your urine pale yellow. This is especially important if you have a fever, vomiting, or diarrhea. Those symptoms can lead to dehydration.  Follow instructions from your health care provider about beverages to avoid.  Do not drink alcohol, caffeine, or drinks that contain a lot of sugar.  You need to eat some form of carbohydrates when you are sick. Eat 45-50 grams (45-50 g) of carbohydrates every 3-4 hours until you feel better. All of the food choices below contain about 15 g of carbohydrates. Plan ahead and keep some of these foods around so you have them if you get sick. ? 4-6 oz  (120-177 mL) carbonated beverage that contains sugar, such as regular (not diet) soda. You may be able to drink carbonated beverages more easily if you open the beverage and let it sit at room temperature for a few minutes before drinking. ?  of a twin frozen ice pop. ? 4 oz (120 g) regular gelatin. ? 4 oz (120 mL) fruit juice. ? 4 oz (120 g) ice cream or frozen yogurt. ? 2 oz (60 g) sherbet. ? 1 slice bread or toast. ? 6 saltine crackers. ? 5 vanilla wafers. Medicines  Take-over-the-counter and prescription medicines only as told by your health care provider.  Check medicine labels for added sugars. Some medicines may contain sugar or types of sugars that can raise your blood glucose level. Questions to ask your health care provider  Should I adjust my diabetes medicines?  How often do I need to check my blood glucose?  What supplies do I need to manage my diabetes at home when I am sick?  What number can I call if I have questions?  What foods and drinks should I avoid? Contact a health care provider if:  You have been sick or have had a fever for 2 days or longer and you are not getting better.  Your blood glucose is at or above 240 mg/dl (43.1 mmol/L), even after you take an additional insulin dose.  You are unable to drink fluids without vomiting.  You have any of the following for more than 6 hours: ? Nausea. ? Vomiting. ? Diarrhea. Get help right away if:  You have difficulty breathing.  You have moderate or high ketone levels in your urine.  You have a change in how you think, feel, or act (mental status).  You develop symptoms of diabetic ketoacidosis. These include: ? Nausea. ? Vomiting. ? Excessive thirst. ? Excessive urination. ? Fruity or sweet smelling breath. ? Rapid breathing. ? Pain in the abdomen.  Your blood glucose is lower than 54mg /dl (3.0 mmol/L).  You used emergency glucagon to treat low blood glucose. These symptoms may represent a  serious problem that is an emergency. Do not wait to see if the symptoms will go away. Get medical help right away. Call your local emergency services (911 in the [place] Do not drive yourself to the hospital. Summary  Blood sugar (glucose) can be difficult to control when you are sick. Common illnesses that can cause problems for people with diabetes (diabetes mellitus) include colds, fever, flu (influenza), nausea, vomiting, and diarrhea.  Illnesses can cause stress and loss of body fluids (dehydration), and those issues can cause blood glucose levels to increase.  Make a plan for days when you are sick (sick day plan) as part of your diabetes management plan. You and your health care provider should make this plan in advance.  It is very important to take your insulin and diabetes medicines and to eat some form of carbohydrate when you are sick.  Contact your health care provider if have problems managing your blood glucose levels when you are sick, or if you have been sick or had a fever for 2 days or longer and are not getting better. This information is not intended to replace advice given to you by your health care provider. Make sure you discuss any questions you have with your health care provider. Document Revised: 02/26/2019 Document Reviewed: 02/26/2019 Elsevier Patient Education  2021 [employer] Inc.  Blood Glucose Monitoring, Adult Monitoring your blood sugar (glucose) is an important part of managing your diabetes. Blood glucose monitoring involves checking your blood glucose as often as directed and keeping a log or record of your results over time. Checking your blood glucose regularly and keeping a blood glucose log can:  Help you and  your health care provider adjust your diabetes management plan as needed, including your medicines or insulin.  Help you understand how food, exercise, illnesses, and medicines affect your blood glucose.  Let you know what your blood glucose is  at any time. You can quickly find out if you have low blood glucose (hypoglycemia) or high blood glucose (hyperglycemia). Your health care provider will set individualized treatment goals for you. Your goals will be based on your age, other medical conditions you have, and how you respond to diabetes treatment. Generally, the goal of treatment is to maintain the following blood glucose levels:  Before meals (preprandial): 80-130 mg/dL (1.6-1.04.4-7.2 mmol/L).  After meals (postprandial): below 180 mg/dL (10 mmol/L).  A1C level: less than 7%. Supplies needed:  Blood glucose meter.  Test strips for your meter. Each meter has its own strips. You must use the strips that came with your meter.  A needle to prick your finger (lancet). Do not use a lancet more than one time.  A device that holds the lancet (lancing device).  A journal or log book to write down your results. How to check your blood glucose Checking your blood glucose 1. Wash your hands for at least 20 seconds with soap and water. 2. Prick the side of your finger (not the tip) with the lancet. Do not use the same finger consecutively. 3. Gently rub the finger until a small drop of blood appears. 4. Follow instructions that come with your meter for inserting the test strip, applying blood to the strip, and using your blood glucose meter. 5. Write down your result and any notes in your log.   Using alternative sites Some meters allow you to use areas of your body other than your finger (alternative sites) to test your blood. The most common alternative sites are the forearm, the thigh, and the palm of your hand. Alternative sites may not be as accurate as the fingers because blood flow is slower in those areas. This means that the result you get may be delayed, and it may be different from the result that you would get from your finger. Use the finger only, and do not use alternative sites, if:  You think you have hypoglycemia.  You  sometimes do not know that your blood glucose is getting low (hypoglycemia unawareness). General tips and recommendations Blood glucose log  Every time you check your blood glucose, write down your result. Also write down any notes about things that may be affecting your blood glucose, such as your diet and exercise for the day. This information can help you and your health care provider: ? Look for patterns in your blood glucose over time. ? Adjust your diabetes management plan as needed.  Check if your meter allows you to download your records to a computer or if there is an app for the meter. Most glucose meters store a record of glucose readings in the meter.   If you have type 1 diabetes:  Check your blood glucose 4 or more times a day if you are on intensive insulin therapy with multiple daily injections (MDI) or if you are using an insulin pump. Check your blood glucose: ? Before every meal and snack. ? Before bedtime.  Also check your blood glucose: ? If you have symptoms of hypoglycemia. ? After treating low blood glucose. ? Before doing activities that create a risk for injury, like driving or using machinery. ? Before and after exercise. ? Two hours after a  meal. ? Occasionally between 2:00 a.m. and 3:00 a.m., as directed.  You may need to check your blood glucose more often, 6-10 times per day, if: ? You have diabetes that is not well controlled. ? You are ill. ? You have a history of severe hypoglycemia. ? You have hypoglycemia unawareness. If you have type 2 diabetes:  Check your blood glucose 2 or more times a day if you take insulin or other diabetes medicines.  Check your blood glucose 4 or more times a day if you are on intensive insulin therapy. Occasionally, you may also need to check your glucose between 2:00 a.m. and 3:00 a.m., as directed.  Also check your blood glucose: ? Before and after exercise. ? Before doing activities that create a risk for injury,  like driving or using machinery.  You may need to check your blood glucose more often if: ? Your medicine is being adjusted. ? Your diabetes is not well controlled. ? You are ill. General tips  Make sure you always have your supplies with you.  After you use a few boxes of test strips, adjust (calibrate) your blood glucose meter by following instructions that came with your meter.  If you have questions or need help, all blood glucose meters have a 24-hour hotline phone number available that you can call. Also contact your health care provider with questions or concerns you may have. Where to find more information  The American Diabetes Association: www.diabetes.org  The Association of Diabetes Care & Education Specialists: www.diabeteseducator.org Contact a health care provider if:  Your blood glucose is at or above 240 mg/dL (83.3 mmol/L) for 2 days in a row.  You have been sick or have had a fever for 2 days or longer, and you are not getting better.  You have any of the following problems for more than 6 hours: ? You cannot eat or drink. ? You have nausea or vomiting. ? You have diarrhea. Get help right away if:  Your blood glucose is lower than 54 mg/dL (3 mmol/L).  You become confused, or you have trouble thinking clearly.  You have difficulty breathing.  You have moderate or large ketone levels in your urine. These symptoms may represent a serious problem that is an emergency. Do not wait to see if the symptoms will go away. Get medical help right away. Call your local emergency services (911 in the [place] Do not drive yourself to the hospital. Summary  Monitoring your blood glucose is an important part of managing your diabetes.  Blood glucose monitoring involves checking your blood glucose as often as directed and keeping a log or record of your results over time.  Your health care provider will set individualized treatment goals for you. Your goals will be based  on your age, other medical conditions you have, and how you respond to diabetes treatment.  Every time you check your blood glucose, write down your result. Also, write down any notes about things that may be affecting your blood glucose, such as your diet and exercise for the day. This information is not intended to replace advice given to you by your health care provider. Make sure you discuss any questions you have with your health care provider. Document Revised: 11/04/2019 Document Reviewed: 11/04/2019 Elsevier Patient Education  2021 [employer].

## 2020-04-19 NOTE — Plan of Care (Signed)
VSS stable, no distress, alert & oriented, will transition off insulin gtt per orders

## 2020-04-19 NOTE — Discharge Summary (Addendum)
Physician Discharge Summary  Susan [name] ZOX:[id number] DOB: October 24, 2000 DOA: 04/18/2020  PCP: System, Provider Not In  Admit date: 04/18/2020 Discharge date: 04/19/2020  Admitted From: Home  Disposition:   Home   Recommendations for Outpatient Follow-up and new medication changes:  1. Follow up with Primary Care in 7 days.  2. Patient started on insulin therapy.   Home Health: na   Equipment/Devices: na    Discharge Condition: stable  CODE STATUS: full  Diet recommendation: diabetic prudent.   Brief/Interim Summary: Susan [name] was admitted to the hospital with a working diagnosis of diabetes ketoacidosis.  20 year old female with past medical history of type 1 diabetes mellitus who presented to the hospital with a chief complaint of increased thirst, generalized weakness and dyspnea for about 2 weeks.  Her symptoms were progressive, prompted her to come to the hospital.  In 2020 she was diagnosed with prediabetes, apparently then she had DKA as well.  On her initial physical examination blood pressure 136/99, heart rate 115, respiratory rate 18, temperature 98.2, oxygen saturation 98%, her lungs were clear to auscultation bilaterally, heart S1-S2, present rhythmic, soft abdomen, no lower extremity edema.  Sodium 129, potassium 3.5, chloride 102, bicarb 10, glucose 269, BUN 9, creatinine 0.96, anion gap 17, magnesium 1.9, venous pH 7.19, white count 4.9, hemoglobin 13.6, hematocrit 40.5, platelets 209. SARS COVID-19 negative. Urinalysis >500 glucose, 100 protein, specific gravity 1.030.  Chest radiograph no infiltrates. EKG 94 bpm, normal axis, normal intervals, sinus rhythm, no ST segment or T wave changes.  Patient placed on insulin drip and IV fluids with improvement of glucose and acidosis. Now transitioned to sq insulin with good toleration, diet advance.   Diabetic teaching and follow up as outpatient.   1.  Diabetes ketoacidosis.  Patient was admitted to the stepdown unit,  she received intravenous insulin and intravenous fluids with improvement of acid-base balance and serum glucose. Her diet was advanced with good toleration, and she was transitioned to subcutaneous insulin.  2.  Type 1 diabetes mellitus.  This is her second episode of diabetes ketoacidosis, she will need lifelong therapy with insulin. She will be discharged on basal insulin (levemir)15 units, premeal short acting (aspart) 5 units, plus sliding scale (Aspart).   Patient had diabetic teaching during her hospitalization.  Follow-up outpatient diabetes education consultation.  3.  Hyponatremia/hypokalemia/hypomagnesemia.  Electrolytes were corrected, she received potassium chloride and magnesium sulfate. At the time of discharge tolerating p.o. diet adequately.  4.  Obesity type I.  Calculated BMI 31.7, outpatient lifestyle modifications.   Discharge Diagnoses:  Principal Problem:   DKA (diabetic ketoacidosis) ([place]) Active Problems:   Hypokalemia   Hyponatremia    Discharge Instructions   Allergies as of 04/19/2020   No Known Allergies     Medication List    TAKE these medications   blood glucose meter kit and supplies Dispense based on patient and insurance preference. Use up to four times daily as directed. (FOR ICD-10 E10.9, E11.9).   cetirizine 10 MG tablet Commonly known as: ZYRTEC Take 10 mg by mouth daily.   insulin aspart 100 UNIT/ML FlexPen Commonly known as: NOVOLOG Inject 5 Units into the skin 3 (three) times daily with meals.   insulin aspart 100 UNIT/ML FlexPen Commonly known as: NOVOLOG For glucose 120 to 150 use 1 unit, for 151 to 200 use 2 units, for 201 to 250 use 3 units, for 251 to 300 use 5 units, for 301 to 350 use 7 units, for 351 to 400  use 9 units.   insulin glargine 100 UNIT/ML Solostar Pen Commonly known as: LANTUS Inject 15 Units into the skin daily.   Pen Needles 3/16" 31G X 5 MM Misc 1 Dose by Does not apply route 4 (four) times daily -  after meals and at bedtime. What changed:   how much to take  when to take this       No Known Allergies     Procedures/Studies: DG CHEST PORT 1 VIEW  Result Date: 04/18/2020 CLINICAL DATA:  DKA EXAM: PORTABLE CHEST 1 VIEW COMPARISON:  None. FINDINGS: The heart size and mediastinal contours are within normal limits. Both lungs are clear. The visualized skeletal structures are unremarkable. IMPRESSION: No active disease. Electronically Signed   By: Prudencio Pair M.D.   On: 04/18/2020 17:26       Subjective: Patient is feeling better, no nausea or vomiting, tolerating po well,. No chest pain or dyspnea.   Discharge Exam: Vitals:   04/19/20 0700 04/19/20 0800  BP: (!) 137/93 138/88  Pulse: 90 88  Resp: 18 15  Temp:  98.3 F (36.8 C)  SpO2: 98% 99%   Vitals:   04/19/20 0300 04/19/20 0400 04/19/20 0700 04/19/20 0800  BP: (!) 135/91 132/89 (!) 137/93 138/88  Pulse: 96 95 90 88  Resp: '16 17 18 15  ' Temp:  97.9 F (36.6 C)  98.3 F (36.8 C)  TempSrc:  Oral  Oral  SpO2: 97% 95% 98% 99%  Weight:      Height:        General: Not in pain or dyspnea. Neurology: Awake and alert, non focal  E ENT: mild pallor, no icterus, oral mucosa moist Cardiovascular: No JVD. S1-S2 present, rhythmic, no gallops, rubs, or murmurs. No lower extremity edema. Pulmonary: positive breath sounds bilaterally, adequate air movement, no wheezing, rhonchi or rales. Gastrointestinal. Abdomen soft and non tender Skin. No rashes Musculoskeletal: no joint deformities   The results of significant diagnostics from this hospitalization (including imaging, microbiology, ancillary and laboratory) are listed below for reference.     Microbiology: Recent Results (from the past 240 hour(s))  Group A Strep by PCR     Status: None   Collection Time: 04/18/20  3:45 PM   Specimen: Throat; Sterile Swab  Result Value Ref Range Status   Group A Strep by PCR NOT DETECTED NOT DETECTED Final    Comment:  Performed at [facility], [place] [address] [place],  [address]  Resp Panel by RT-PCR (Flu A&B, Covid) Nasopharyngeal Swab     Status: None   Collection Time: 04/18/20  4:00 PM   Specimen: Nasopharyngeal Swab; Nasopharyngeal(NP) swabs in vial transport medium  Result Value Ref Range Status   SARS Coronavirus 2 by RT PCR NEGATIVE NEGATIVE Final    Comment: (NOTE) SARS-CoV-2 target nucleic acids are NOT DETECTED.  The SARS-CoV-2 RNA is generally detectable in upper respiratory specimens during the acute phase of infection. The lowest concentration of SARS-CoV-2 viral copies this assay can detect is 138 copies/mL. A negative result does not preclude SARS-Cov-2 infection and should not be used as the sole basis for treatment or other patient management decisions. A negative result may occur with  improper specimen collection/handling, submission of specimen other than nasopharyngeal swab, presence of viral mutation(s) within the areas targeted by this assay, and inadequate number of viral copies(<138 copies/mL). A negative result must be combined with clinical observations, patient history, and epidemiological information. The expected result is Negative.  Fact  Sheet for Patients:  EntrepreneurPulse.com.au  Fact Sheet for Healthcare Providers:  IncredibleEmployment.be  This test is no t yet approved or cleared by the [place] FDA and  has been authorized for detection and/or diagnosis of SARS-CoV-2 by FDA under an Emergency Use Authorization (EUA). This EUA will remain  in effect (meaning this test can be used) for the duration of the COVID-19 declaration under Section 564(b)(1) of the Act, 21 U.S.C.section 360bbb-3(b)(1), unless the authorization is terminated  or revoked sooner.       Influenza A by PCR NEGATIVE NEGATIVE Final   Influenza B by PCR NEGATIVE NEGATIVE Final    Comment: (NOTE) The Xpert Xpress  SARS-CoV-2/FLU/RSV plus assay is intended as an aid in the diagnosis of influenza from Nasopharyngeal swab specimens and should not be used as a sole basis for treatment. Nasal washings and aspirates are unacceptable for Xpert Xpress SARS-CoV-2/FLU/RSV testing.  Fact Sheet for Patients: EntrepreneurPulse.com.au  Fact Sheet for Healthcare Providers: IncredibleEmployment.be  This test is not yet approved or cleared by the [place] FDA and has been authorized for detection and/or diagnosis of SARS-CoV-2 by FDA under an Emergency Use Authorization (EUA). This EUA will remain in effect (meaning this test can be used) for the duration of the COVID-19 declaration under Section 564(b)(1) of the Act, 21 U.S.C. section 360bbb-3(b)(1), unless the authorization is terminated or revoked.  Performed at [facility], [place] [address], [place], [place] [address]   MRSA PCR Screening     Status: None   Collection Time: 04/18/20  5:51 PM   Specimen: Nasal Mucosa; Nasopharyngeal  Result Value Ref Range Status   MRSA by PCR NEGATIVE NEGATIVE Final    Comment:        The GeneXpert MRSA Assay (FDA approved for NASAL specimens only), is one component of a comprehensive MRSA colonization surveillance program. It is not intended to diagnose MRSA infection nor to guide or monitor treatment for MRSA infections. Performed at [facility], [place] [address] [place], [place] [address]      Labs: BNP (last 3 results) No results for input(s): BNP in the last 8760 hours. Basic Metabolic Panel: Recent Labs  Lab 04/18/20 1448 04/18/20 1600 04/18/20 1924 04/19/20 0020 04/19/20 0426  NA 129* 129* 132* 134* 134*  K 3.1* 3.5 2.9* 2.9* 2.9*  CL 99 102 105 107 106  CO2 10* 10* 13* 16* 17*  GLUCOSE 329* 269* 141* 119* 137*  BUN '9 9 6 ' <5* 5*  CREATININE 0.91 0.96 0.73 0.73 0.75  CALCIUM 9.4 8.4* 8.8* 9.2 9.2  MG  --  1.9  --    --   --    Liver Function Tests: No results for input(s): AST, ALT, ALKPHOS, BILITOT, PROT, ALBUMIN in the last 168 hours. No results for input(s): LIPASE, AMYLASE in the last 168 hours. No results for input(s): AMMONIA in the last 168 hours. CBC: Recent Labs  Lab 04/18/20 1448 04/18/20 1600 04/19/20 0426  WBC 5.4 4.9 4.7  NEUTROABS  --  2.5  --   HGB 14.5 13.6 13.0  HCT 43.1 40.5 38.3  MCV 88.1 89.4 88.0  PLT 270 209 238   Cardiac Enzymes: No results for input(s): CKTOTAL, CKMB, CKMBINDEX, TROPONINI in the last 168 hours. BNP: Invalid input(s): POCBNP CBG: Recent Labs  Lab 04/19/20 0108 04/19/20 0146 04/19/20 0303 04/19/20 0405 04/19/20 0817  GLUCAP 126* 129* 145* 144* 201*   D-Dimer No results for input(s): DDIMER in the last 72  hours. Hgb A1c Recent Labs    04/18/20 1924  HGBA1C 10.3*   Lipid Profile No results for input(s): CHOL, HDL, LDLCALC, TRIG, CHOLHDL, LDLDIRECT in the last 72 hours. Thyroid function studies No results for input(s): TSH, T4TOTAL, T3FREE, THYROIDAB in the last 72 hours.  Invalid input(s): FREET3 Anemia work up No results for input(s): VITAMINB12, FOLATE, FERRITIN, TIBC, IRON, RETICCTPCT in the last 72 hours. Urinalysis    Component Value Date/Time   COLORURINE YELLOW 04/18/2020 1519   APPEARANCEUR CLEAR 04/18/2020 1519   LABSPEC 1.030 04/18/2020 1519   PHURINE 5.0 04/18/2020 1519   GLUCOSEU >=500 (A) 04/18/2020 1519   HGBUR NEGATIVE 04/18/2020 1519   BILIRUBINUR NEGATIVE 04/18/2020 1519   KETONESUR 80 (A) 04/18/2020 1519   PROTEINUR 100 (A) 04/18/2020 1519   NITRITE NEGATIVE 04/18/2020 1519   LEUKOCYTESUR NEGATIVE 04/18/2020 1519   Sepsis Labs Invalid input(s): PROCALCITONIN,  WBC,  LACTICIDVEN Microbiology Recent Results (from the past 240 hour(s))  Group A Strep by PCR     Status: None   Collection Time: 04/18/20  3:45 PM   Specimen: Throat; Sterile Swab  Result Value Ref Range Status   Group A Strep by PCR NOT  DETECTED NOT DETECTED Final    Comment: Performed at [facility], [place] [address], [place], [place] [address]  Resp Panel by RT-PCR (Flu A&B, Covid) Nasopharyngeal Swab     Status: None   Collection Time: 04/18/20  4:00 PM   Specimen: Nasopharyngeal Swab; Nasopharyngeal(NP) swabs in vial transport medium  Result Value Ref Range Status   SARS Coronavirus 2 by RT PCR NEGATIVE NEGATIVE Final    Comment: (NOTE) SARS-CoV-2 target nucleic acids are NOT DETECTED.  The SARS-CoV-2 RNA is generally detectable in upper respiratory specimens during the acute phase of infection. The lowest concentration of SARS-CoV-2 viral copies this assay can detect is 138 copies/mL. A negative result does not preclude SARS-Cov-2 infection and should not be used as the sole basis for treatment or other patient management decisions. A negative result may occur with  improper specimen collection/handling, submission of specimen other than nasopharyngeal swab, presence of viral mutation(s) within the areas targeted by this assay, and inadequate number of viral copies(<138 copies/mL). A negative result must be combined with clinical observations, patient history, and epidemiological information. The expected result is Negative.  Fact Sheet for Patients:  EntrepreneurPulse.com.au  Fact Sheet for Healthcare Providers:  IncredibleEmployment.be  This test is no t yet approved or cleared by the [place] FDA and  has been authorized for detection and/or diagnosis of SARS-CoV-2 by FDA under an Emergency Use Authorization (EUA). This EUA will remain  in effect (meaning this test can be used) for the duration of the COVID-19 declaration under Section 564(b)(1) of the Act, 21 U.S.C.section 360bbb-3(b)(1), unless the authorization is terminated  or revoked sooner.       Influenza A by PCR NEGATIVE NEGATIVE Final   Influenza B by PCR NEGATIVE NEGATIVE Final     Comment: (NOTE) The Xpert Xpress SARS-CoV-2/FLU/RSV plus assay is intended as an aid in the diagnosis of influenza from Nasopharyngeal swab specimens and should not be used as a sole basis for treatment. Nasal washings and aspirates are unacceptable for Xpert Xpress SARS-CoV-2/FLU/RSV testing.  Fact Sheet for Patients: EntrepreneurPulse.com.au  Fact Sheet for Healthcare Providers: IncredibleEmployment.be  This test is not yet approved or cleared by the [place] FDA and has been authorized for detection and/or diagnosis of SARS-CoV-2 by FDA under an Emergency Use  Authorization (EUA). This EUA will remain in effect (meaning this test can be used) for the duration of the COVID-19 declaration under Section 564(b)(1) of the Act, 21 U.S.C. section 360bbb-3(b)(1), unless the authorization is terminated or revoked.  Performed at [facility], [place] [address] [place], [place] [address]   MRSA PCR Screening     Status: None   Collection Time: 04/18/20  5:51 PM   Specimen: Nasal Mucosa; Nasopharyngeal  Result Value Ref Range Status   MRSA by PCR NEGATIVE NEGATIVE Final    Comment:        The GeneXpert MRSA Assay (FDA approved for NASAL specimens only), is one component of a comprehensive MRSA colonization surveillance program. It is not intended to diagnose MRSA infection nor to guide or monitor treatment for MRSA infections. Performed at [facility], [place] [address] [place], [place] [address]      Time coordinating discharge: 45 minutes  SIGNED:   Tawni Millers, MD  [facility] 04/19/2020, 9:23 AM

## 2020-04-19 NOTE — TOC Initial Note (Signed)
Transition of Care [facility]) - Initial/Assessment Note    Patient Details  Name: Susan [name] MRN: [id number] Date of Birth: [birth year]  Transition of Care [facility]) CM/SW Contact:    Golda Acre, RN Phone Number: 04/19/2020, 7:56 AM  Clinical Narrative:                 20 year old female who has been vaccinated against COVID 19 x3 with past medical history of diabetes suspected type I, who presented to the ED with complaints of increased thirst, dry mucous membranes, generalized weakness, shortness of breath for the past two weeks which had gradually worsened. She noticed that her glucose was higher also up to 190s. She was hospitalized in 2020 for DKA and newly diagnosed diabetes and was seen by an endocrinologist out of state (goes to school in [place]) and was told that she is a prediabetic and did not need insulin. She has not been on insulin for at least the past year. Started feeling ill two weeks ago. She went to an urgent care today and was sent to the ER for elevated blood sugar and ketones in her urine.    ED Course: Afebrile, tachycardic, hemodynamically stable, on room air. Notable Labs: Sodium 129, K3.1, CO2 10, glucose 329, BUN 9, creatinine 0.91, AG 20, WBC 5.4, Hb 14.5, platelets 270, COVID-19 pending. Patient received 1 L NS bolus and is to start on insulin drip and fluids per DKA protocol.Marland Kitchen  PLAN: to return to home with self care. Contact is for Mother.  Pt lives alone.  Expected Discharge Plan: Home/Self Care Barriers to Discharge: Continued Medical Work up   Patient Goals and CMS Choice Patient states their goals for this hospitalization and ongoing recovery are:: to go home CMS Medicare.gov Compare Post Acute Care list provided to:: Patient    Expected Discharge Plan and Services Expected Discharge Plan: Home/Self Care   Discharge Planning Services: CM Consult   Living arrangements for the past 2 months: Apartment                                      Prior  Living Arrangements/Services Living arrangements for the past 2 months: Apartment Lives with:: Self Patient language and need for interpreter reviewed:: Yes Do you feel safe going back to the place where you live?: Yes      Need for Family Participation in Patient Care: Yes (Comment) Care giver support system in place?: Yes (comment)   Criminal Activity/Legal Involvement Pertinent to Current Situation/Hospitalization: No - Comment as needed  Activities of Daily Living Home Assistive Devices/Equipment: Eyeglasses,CBG Meter ADL Screening (condition at time of admission) Patient's cognitive ability adequate to safely complete daily activities?: Yes Is the patient deaf or have difficulty hearing?: No Does the patient have difficulty seeing, even when wearing glasses/contacts?: No Does the patient have difficulty concentrating, remembering, or making decisions?: No Patient able to express need for assistance with ADLs?: Yes Does the patient have difficulty dressing or bathing?: No Independently performs ADLs?: Yes (appropriate for developmental age) Does the patient have difficulty walking or climbing stairs?: No Weakness of Legs: None Weakness of Arms/Hands: None  Permission Sought/Granted                  Emotional Assessment Appearance:: Appears stated age Attitude/Demeanor/Rapport: Engaged Affect (typically observed): Calm Orientation: : Oriented to Self,Oriented to Place,Oriented to  Time,Oriented to Situation Alcohol / Substance Use: Not  Applicable Psych Involvement: No (comment)  Admission diagnosis:  SOB (shortness of breath) [R06.02] DKA (diabetic ketoacidosis) (HCC) [E11.10] Diabetic ketoacidosis without coma associated with other specified diabetes mellitus (HCC) [E13.10] Patient Active Problem List   Diagnosis Date Noted  . DKA (diabetic ketoacidosis) (HCC) 04/18/2020  . Hyponatremia 04/18/2020  . Hypokalemia 12/05/2018  . Hypophosphatemia 12/05/2018  . DKA  (diabetic ketoacidoses) 12/03/2018   PCP:  System, Provider Not In Pharmacy:   [facility] [phone] - [place], [place] - [address] AT [facility] [address] [place] [address] Phone: [phone] Fax: [phone]  [id number] [place], [place] - [address] AT [facility] OF [facility] [address] [place] [address] Phone: [phone] Fax: [phone]     Social Determinants of Health (SDOH) Interventions    Readmission Risk Interventions No flowsheet data found.

## 2020-04-19 NOTE — Progress Notes (Signed)
Discharge education provided, questions concerns denied at this time. Patient remains stable A&Ox4, ambulatory without assistance.

## 2020-04-19 NOTE — Progress Notes (Signed)
Inpatient Diabetes Program Recommendations  AACE/ADA: New Consensus Statement on Inpatient Glycemic Control (2015)  Target Ranges:  Prepandial:   less than 140 mg/dL      Peak postprandial:   less than 180 mg/dL (1-2 hours)      Critically ill patients:  140 - 180 mg/dL   Lab Results  Component Value Date   GLUCAP 201 (H) 04/19/2020   HGBA1C 10.3 (H) 04/18/2020    Review of Glycemic Control  Diabetes history: DM2, now evolving to 1 Outpatient Diabetes medications: None Current orders for Inpatient glycemic control: Lantus 10 units Q24H, Novolog 0-9 units TID with meals and 0-5 HS + 3 units TID for meal coverage  HgbA1C - 10.3%  Inpatient Diabetes Program Recommendations:      For discharge:  Increase Lantus to 15 units QHS Novolog 5 units TID with meals  Pt states she will check blood sugars 3-4x/day and take meter to MD appt on 04/21/20 at [facility].  Discussed pt likely evolving to a Type 1. Prefers insulin pens. Reviewed diet, exercise and importance of monitoring blood sugars. Goal of HgbA1C - 7%, to reduce risk of macro and microvascular complications. Answered questions. Ordered Outpatient Diabetes Education consult. Reviewed insulin pen administration and pt was able to return demonstration. Discussed hypoglycemia s/s and treatment.   Discussed above with RN.  Will need prescription for Lantus Solostar pen, Novolog flexpen, insulin pen needles [phone]). Has meter and supplies at home.   Thank you. Ailene Ards, RD, LDN, CDE Inpatient Diabetes Coordinator [phone]

## 2020-04-19 NOTE — Progress Notes (Signed)
Diabetes education reviewed. Patient administered insulin. Questions concerns denied at this time.

## 2020-05-29 ENCOUNTER — Encounter (HOSPITAL_COMMUNITY): Payer: Self-pay

## 2020-05-29 ENCOUNTER — Inpatient Hospital Stay (HOSPITAL_COMMUNITY)
Admission: EM | Admit: 2020-05-29 | Discharge: 2020-06-01 | DRG: 638 | Disposition: A | Discharge status: Home / Self Care | Payer: Medicaid - Out of State | Source: Ambulatory Visit | Attending: Internal Medicine | Admitting: Internal Medicine

## 2020-05-29 ENCOUNTER — Emergency Department (HOSPITAL_COMMUNITY): Payer: Medicaid - Out of State

## 2020-05-29 ENCOUNTER — Other Ambulatory Visit: Payer: Self-pay

## 2020-05-29 DIAGNOSIS — E1065 Type 1 diabetes mellitus with hyperglycemia: Secondary | ICD-10-CM | POA: Diagnosis present

## 2020-05-29 DIAGNOSIS — E1010 Type 1 diabetes mellitus with ketoacidosis without coma: Secondary | ICD-10-CM | POA: Diagnosis not present

## 2020-05-29 DIAGNOSIS — E1110 Type 2 diabetes mellitus with ketoacidosis without coma: Secondary | ICD-10-CM | POA: Diagnosis present

## 2020-05-29 DIAGNOSIS — E871 Hypo-osmolality and hyponatremia: Secondary | ICD-10-CM | POA: Diagnosis present

## 2020-05-29 DIAGNOSIS — Z79899 Other long term (current) drug therapy: Secondary | ICD-10-CM

## 2020-05-29 DIAGNOSIS — E876 Hypokalemia: Secondary | ICD-10-CM | POA: Diagnosis present

## 2020-05-29 DIAGNOSIS — Z794 Long term (current) use of insulin: Secondary | ICD-10-CM

## 2020-05-29 DIAGNOSIS — Z833 Family history of diabetes mellitus: Secondary | ICD-10-CM

## 2020-05-29 DIAGNOSIS — R7989 Other specified abnormal findings of blood chemistry: Secondary | ICD-10-CM

## 2020-05-29 DIAGNOSIS — E669 Obesity, unspecified: Secondary | ICD-10-CM | POA: Diagnosis present

## 2020-05-29 DIAGNOSIS — Z20822 Contact with and (suspected) exposure to covid-19: Secondary | ICD-10-CM | POA: Diagnosis present

## 2020-05-29 DIAGNOSIS — R112 Nausea with vomiting, unspecified: Secondary | ICD-10-CM

## 2020-05-29 DIAGNOSIS — Z6832 Body mass index (BMI) 32.0-32.9, adult: Secondary | ICD-10-CM

## 2020-05-29 HISTORY — DX: Type 2 diabetes mellitus without complications: E11.9

## 2020-05-29 LAB — CBC WITH DIFFERENTIAL/PLATELET
Abs Immature Granulocytes: 0.05 10*3/uL (ref 0.00–0.07)
Basophils Absolute: 0.1 10*3/uL (ref 0.0–0.1)
Basophils Relative: 1 %
Eosinophils Absolute: 0.6 10*3/uL — ABNORMAL HIGH (ref 0.0–0.5)
Eosinophils Relative: 9 %
HCT: 50.6 % — ABNORMAL HIGH (ref 36.0–46.0)
Hemoglobin: 16.3 g/dL — ABNORMAL HIGH (ref 12.0–15.0)
Immature Granulocytes: 1 %
Lymphocytes Relative: 16 %
Lymphs Abs: 1 10*3/uL (ref 0.7–4.0)
MCH: 30.1 pg (ref 26.0–34.0)
MCHC: 32.2 g/dL (ref 30.0–36.0)
MCV: 93.4 fL (ref 80.0–100.0)
Monocytes Absolute: 0.7 10*3/uL (ref 0.1–1.0)
Monocytes Relative: 11 %
Neutro Abs: 4 10*3/uL (ref 1.7–7.7)
Neutrophils Relative %: 62 %
Platelets: 240 10*3/uL (ref 150–400)
RBC: 5.42 MIL/uL — ABNORMAL HIGH (ref 3.87–5.11)
RDW: 14.6 % (ref 11.5–15.5)
WBC: 6.3 10*3/uL (ref 4.0–10.5)
nRBC: 0 % (ref 0.0–0.2)

## 2020-05-29 LAB — URINALYSIS, ROUTINE W REFLEX MICROSCOPIC
Bacteria, UA: NONE SEEN
Bilirubin Urine: NEGATIVE
Glucose, UA: >=500 mg/dL — AB
Hgb urine dipstick: NEGATIVE
Ketones, ur: 80 mg/dL — AB
Leukocytes,Ua: NEGATIVE
Nitrite: NEGATIVE
Protein, ur: 100 mg/dL — AB
Specific Gravity, Urine: 1.022 (ref 1.005–1.030)
pH: 5 (ref 5.0–8.0)

## 2020-05-29 LAB — CBG MONITORING, ED
Glucose-Capillary: 387 mg/dL — ABNORMAL HIGH (ref 70–99)
Glucose-Capillary: 303 mg/dL — ABNORMAL HIGH (ref 70–99)
Glucose-Capillary: 316 mg/dL — ABNORMAL HIGH (ref 70–99)
Glucose-Capillary: 227 mg/dL — ABNORMAL HIGH (ref 70–99)

## 2020-05-29 LAB — RESP PANEL BY RT-PCR (FLU A&B, COVID) ARPGX2
Influenza A by PCR: NEGATIVE
Influenza B by PCR: NEGATIVE
SARS Coronavirus 2 by RT PCR: NEGATIVE

## 2020-05-29 LAB — BLOOD GAS, VENOUS
Acid-base deficit: 22.1 mmol/L — ABNORMAL HIGH (ref 0.0–2.0)
Bicarbonate: 7 mmol/L — ABNORMAL LOW (ref 20.0–28.0)
O2 Saturation: 62 %
Patient temperature: 98.6
pCO2, Ven: 22.8 mmHg — ABNORMAL LOW (ref 44.0–60.0)
pH, Ven: 7.114 — CL (ref 7.250–7.430)
pO2, Ven: 38.4 mmHg (ref 32.0–45.0)

## 2020-05-29 LAB — COMPREHENSIVE METABOLIC PANEL
ALT: 9 U/L (ref 0–44)
AST: 15 U/L (ref 15–41)
Albumin: 4.9 g/dL (ref 3.5–5.0)
Alkaline Phosphatase: 172 U/L — ABNORMAL HIGH (ref 38–126)
Anion gap: 20 — ABNORMAL HIGH (ref 5–15)
BUN: 13 mg/dL (ref 6–20)
CO2: 8 mmol/L — ABNORMAL LOW (ref 22–32)
Calcium: 9.8 mg/dL (ref 8.9–10.3)
Chloride: 102 mmol/L (ref 98–111)
Creatinine, Ser: 1.09 mg/dL — ABNORMAL HIGH (ref 0.44–1.00)
GFR, Estimated: >60 mL/min (ref >60)
Glucose, Bld: 377 mg/dL — ABNORMAL HIGH (ref 70–99)
Potassium: 3.9 mmol/L (ref 3.5–5.1)
Sodium: 130 mmol/L — ABNORMAL LOW (ref 135–145)
Total Bilirubin: 1.2 mg/dL (ref 0.3–1.2)
Total Protein: 9.9 g/dL — ABNORMAL HIGH (ref 6.5–8.1)

## 2020-05-29 LAB — BASIC METABOLIC PANEL
Anion gap: 17 — ABNORMAL HIGH (ref 5–15)
BUN: 12 mg/dL (ref 6–20)
CO2: 7 mmol/L — ABNORMAL LOW (ref 22–32)
Calcium: 8.5 mg/dL — ABNORMAL LOW (ref 8.9–10.3)
Chloride: 106 mmol/L (ref 98–111)
Creatinine, Ser: 1 mg/dL (ref 0.44–1.00)
GFR, Estimated: >60 mL/min (ref >60)
Glucose, Bld: 311 mg/dL — ABNORMAL HIGH (ref 70–99)
Potassium: 4.8 mmol/L (ref 3.5–5.1)
Sodium: 130 mmol/L — ABNORMAL LOW (ref 135–145)

## 2020-05-29 LAB — GLUCOSE, CAPILLARY
Glucose-Capillary: 163 mg/dL — ABNORMAL HIGH (ref 70–99)
Glucose-Capillary: 194 mg/dL — ABNORMAL HIGH (ref 70–99)
Glucose-Capillary: 169 mg/dL — ABNORMAL HIGH (ref 70–99)
Glucose-Capillary: 140 mg/dL — ABNORMAL HIGH (ref 70–99)
Glucose-Capillary: 170 mg/dL — ABNORMAL HIGH (ref 70–99)

## 2020-05-29 LAB — LIPASE, BLOOD: Lipase: 56 U/L — ABNORMAL HIGH (ref 11–51)

## 2020-05-29 LAB — BETA-HYDROXYBUTYRIC ACID: Beta-Hydroxybutyric Acid: >8 mmol/L — ABNORMAL HIGH (ref 0.05–0.27)

## 2020-05-29 LAB — I-STAT CHEM 8, ED
BUN: 12 mg/dL (ref 6–20)
Calcium, Ion: 1.34 mmol/L (ref 1.15–1.40)
Chloride: 105 mmol/L (ref 98–111)
Creatinine, Ser: 0.8 mg/dL (ref 0.44–1.00)
Glucose, Bld: 382 mg/dL — ABNORMAL HIGH (ref 70–99)
HCT: 53 % — ABNORMAL HIGH (ref 36.0–46.0)
Hemoglobin: 18 g/dL — ABNORMAL HIGH (ref 12.0–15.0)
Potassium: 4 mmol/L (ref 3.5–5.1)
Sodium: 132 mmol/L — ABNORMAL LOW (ref 135–145)
TCO2: 10 mmol/L — ABNORMAL LOW (ref 22–32)

## 2020-05-29 LAB — I-STAT BETA HCG BLOOD, ED (MC, WL, AP ONLY): I-stat hCG, quantitative: <5 m[IU]/mL (ref <5)

## 2020-05-29 LAB — MAGNESIUM: Magnesium: 2.2 mg/dL (ref 1.7–2.4)

## 2020-05-29 LAB — MRSA PCR SCREENING: MRSA by PCR: NEGATIVE

## 2020-05-29 MED ORDER — LACTATED RINGERS IV SOLN: INTRAVENOUS | Status: DC

## 2020-05-29 MED ORDER — ENOXAPARIN SODIUM 40 MG/0.4ML ~~LOC~~ SOLN
40.0000 mg | SUBCUTANEOUS | Status: DC
  Administered 2020-05-30 – 2020-05-31 (×2): 40 mg via SUBCUTANEOUS
  Filled 2020-05-29 (×2): qty 0.4

## 2020-05-29 MED ORDER — ONDANSETRON HCL 4 MG/2ML IJ SOLN: 4.0000 mg | Freq: Four times a day (QID) | INTRAMUSCULAR | Status: DC | PRN

## 2020-05-29 MED ORDER — ONDANSETRON HCL 4 MG/2ML IJ SOLN
4.0000 mg | Freq: Once | INTRAMUSCULAR | Status: AC
  Administered 2020-05-29: 4 mg via INTRAVENOUS
  Filled 2020-05-29: qty 2

## 2020-05-29 MED ORDER — DEXTROSE IN LACTATED RINGERS 5 % IV SOLN: INTRAVENOUS | Status: DC

## 2020-05-29 MED ORDER — LACTATED RINGERS IV BOLUS
20.0000 mL/kg | Freq: Once | INTRAVENOUS | Status: AC
  Administered 2020-05-29: 1906 mL via INTRAVENOUS

## 2020-05-29 MED ORDER — POTASSIUM CHLORIDE 10 MEQ/100ML IV SOLN
10.0000 meq | INTRAVENOUS | Status: AC
Start: 2020-05-29 — End: 2020-05-29
  Administered 2020-05-29 (×2): 10 meq via INTRAVENOUS
  Filled 2020-05-29 (×2): qty 100

## 2020-05-29 MED ORDER — DEXTROSE 50 % IV SOLN: 0.0000 mL | INTRAVENOUS | Status: DC | PRN

## 2020-05-29 MED ORDER — INSULIN REGULAR(HUMAN) IN NACL 100-0.9 UT/100ML-% IV SOLN
INTRAVENOUS | Status: DC
  Administered 2020-05-29: 19 [IU]/h via INTRAVENOUS
  Administered 2020-05-30: 9.5 [IU]/h via INTRAVENOUS
  Filled 2020-05-29 (×2): qty 100

## 2020-05-29 MED ORDER — SODIUM CHLORIDE 0.9 % IV BOLUS
2000.0000 mL | Freq: Once | INTRAVENOUS | Status: AC
  Administered 2020-05-29: 2000 mL via INTRAVENOUS

## 2020-05-29 MED ORDER — CHLORHEXIDINE GLUCONATE CLOTH 2 % EX PADS
6.0000 | MEDICATED_PAD | Freq: Every day | CUTANEOUS | Status: DC
  Administered 2020-05-29 – 2020-05-31 (×3): 6 via TOPICAL

## 2020-05-29 MED ORDER — ONDANSETRON HCL 4 MG PO TABS: 4.0000 mg | ORAL_TABLET | Freq: Four times a day (QID) | ORAL | Status: DC | PRN

## 2020-05-29 NOTE — ED Notes (Signed)
Critical Lab   Ph 7.114 Reported to B Henderly, PA

## 2020-05-29 NOTE — ED Notes (Signed)
Urine cup given to pt.

## 2020-05-29 NOTE — H&P (Signed)
[facility] History and Physical  Susan [name] IRC:[id number] DOB: September 23, 2000 DOA: 05/29/2020  Referring physician: ED  PCP: System, Provider Not In   Patient is coming from: Home  Chief Complaint: Vomiting  HPI: Susan [name] is a 20 y.o. female with history of type 1 diabetes on insulin recently diagnosed in March 2022, presented hospital with complaints of vomiting for the last 3 days.  She was initially seen at urgent care center and was sent to the hospital due to ketones in urine.  Patient has been having elevated blood glucose levels for the last 2 to 3 days more than 300s.  In the urgent care blood glucose was almost 400.  Patient denied any dizziness, lightheadedness, cough, fever or chills.  Denies any abdominal pain diarrhea.  Denies any urinary urgency frequency or dysuria.  He was slightly constipated and had taken a laxative.  Patient also complained of mild shortness of breath and palpitations.  Patient complains of mild epigastric discomfort.  She did have history of feeling sick shortly after eating outside food.  Patient is in the process of following up with endocrinologist as outpatient.  She states that she has been using insulin as prescribed  ED Course: In the ED, patient had Kussmaul breathing.  She was mildly tachycardic.  She had low bicarbonate on the BMP and anion gap was elevated.  Patient was diagnosed to be having diabetic ketoacidosis and was considered for admission to the hospital.  Initial sodium was 130, bicarb of 8 with blood glucose level of 377 and creatinine at 1.0.  Anion gap was 20.  Lipase was 56.  Urinalysis was positive for ketones and glucose more than 500.  Beta hydroxybutyrate acid was more than 8.  Initial venous pH was 7.1 in the ED, patient received Ringer lactate bolus 2 L, insulin drip was initiated.  Patient also received potassium chloride as per protocol.  Review of Systems:  All systems were reviewed and were negative unless otherwise  mentioned in the HPI  Past Medical History:  Diagnosis Date  . Diabetes ([place])    History reviewed. No pertinent surgical history.  Social History:  reports that she has never smoked. She has never used smokeless tobacco. She reports that she does not drink alcohol and does not use drugs.  No Known Allergies  Family History  Problem Relation Age of Onset  . Diabetes Mother   . Diabetes Father      Prior to Admission medications   Medication Sig Start Date End Date Taking? Authorizing Provider  cetirizine (ZYRTEC) 10 MG tablet Take 10 mg by mouth daily.   Yes [provider]  insulin aspart (NOVOLOG) 100 UNIT/ML FlexPen For glucose 120 to 150 use 1 unit, for 151 to 200 use 2 units, for 201 to 250 use 3 units, for 251 to 300 use 5 units, for 301 to 350 use 7 units, for 351 to 400 use 9 units. Patient taking differently: Inject 0-9 Units into the skin 3 (three) times daily with meals. For glucose 120 to 150 use 1 unit, for 151 to 200 use 2 units, for 201 to 250 use 3 units, for 251 to 300 use 5 units, for 301 to 350 use 7 units, for 351 to 400 use 9 units. 04/19/20  Yes Arrien, Jimmy Picket, MD  insulin glargine (LANTUS) 100 UNIT/ML Solostar Pen Inject 15 Units into the skin daily. Patient taking differently: Inject 22 Units into the skin daily. 04/19/20 05/19/20 Yes Arrien, Jimmy Picket, MD  ketoconazole (NIZORAL) 2 % shampoo Apply 1 application topically every 14 (fourteen) days. 03/04/20  Yes [provider]  blood glucose meter kit and supplies Dispense based on patient and insurance preference. Use up to four times daily as directed. (FOR ICD-10 E10.9, E11.9). 04/19/20   Arrien, Jimmy Picket, MD  insulin aspart (NOVOLOG) 100 UNIT/ML FlexPen Inject 5 Units into the skin 3 (three) times daily with meals. Patient not taking: Reported on 05/29/2020 04/19/20 05/19/20  Arrien, Jimmy Picket, MD  Insulin Pen Needle (PEN NEEDLES 3/16") 31G X 5 MM MISC 1 Dose by Does not apply  route 4 (four) times daily - after meals and at bedtime. 04/19/20   Tawni Millers, MD    Physical Exam: Vitals:   05/29/20 1530 05/29/20 1545 05/29/20 1600 05/29/20 1617  BP: (!) 128/96 135/85 133/88 135/66  Pulse: (!) 125 (!) 119 (!) 122 (!) 114  Resp: (!) 24 (!) 25 (!) 23 (!) 23  Temp:      TempSrc:      SpO2: 100% 100% 100% 100%  Weight:      Height:       Wt Readings from Last 3 Encounters:  05/29/20 95.3 kg  04/18/20 94.8 kg  12/06/18 89.6 kg (97 %, Z= 1.91)*   * Growth percentiles are based on CDC (Girls, 2-20 Years) data.   Body mass index is 32.89 kg/m.  General:  Average built, not in obvious distress, obese HENT: Normocephalic, pupils equally reacting to light and accommodation.  No scleral pallor or icterus noted. Oral mucosa is dry Chest:  Clear breath sounds.  Diminished breath sounds bilaterally. No crackles or wheezes.  CVS: S1 &S2 heard. No murmur.  Regular rate and rhythm. Abdomen: Soft, nontender, nondistended.  Bowel sounds are heard.  Liver is not palpable, no abdominal mass palpated Extremities: No cyanosis, clubbing or edema.  Peripheral pulses are palpable. Psych: Alert, awake and oriented, normal mood CNS:  No cranial nerve deficits.  Power equal in all extremities.    Skin: Warm and dry.  No rashes noted.  Labs on Admission:   CBC: Recent Labs  Lab 05/29/20 1310 05/29/20 1329  WBC 6.3  --   NEUTROABS 4.0  --   HGB 16.3* 18.0*  HCT 50.6* 53.0*  MCV 93.4  --   PLT 240  --     Basic Metabolic Panel: Recent Labs  Lab 05/29/20 1310 05/29/20 1329 05/29/20 1403  NA 130* 132* 130*  K 3.9 4.0 4.8  CL 102 105 106  CO2 8*  --  7*  GLUCOSE 377* 382* 311*  BUN '13 12 12  ' CREATININE 1.09* 0.80 1.00  CALCIUM 9.8  --  8.5*  MG  --   --  2.2    Liver Function Tests: Recent Labs  Lab 05/29/20 1310  AST 15  ALT 9  ALKPHOS 172*  BILITOT 1.2  PROT 9.9*  ALBUMIN 4.9   Recent Labs  Lab 05/29/20 1310  LIPASE 56*   No results  for input(s): AMMONIA in the last 168 hours.  Cardiac Enzymes: No results for input(s): CKTOTAL, CKMB, CKMBINDEX, TROPONINI in the last 168 hours.  BNP (last 3 results) No results for input(s): BNP in the last 8760 hours.  ProBNP (last 3 results) No results for input(s): PROBNP in the last 8760 hours.  CBG: Recent Labs  Lab 05/29/20 1315 05/29/20 1432 05/29/20 1626  GLUCAP 387* 303* 316*    Lipase     Component Value Date/Time  LIPASE 56 (H) 05/29/2020 1310     Urinalysis    Component Value Date/Time   COLORURINE STRAW (A) 05/29/2020 1444   APPEARANCEUR CLEAR 05/29/2020 1444   LABSPEC 1.022 05/29/2020 1444   PHURINE 5.0 05/29/2020 1444   GLUCOSEU >=500 (A) 05/29/2020 1444   HGBUR NEGATIVE 05/29/2020 1444   BILIRUBINUR NEGATIVE 05/29/2020 1444   KETONESUR 80 (A) 05/29/2020 1444   PROTEINUR 100 (A) 05/29/2020 1444   NITRITE NEGATIVE 05/29/2020 1444   LEUKOCYTESUR NEGATIVE 05/29/2020 1444     Drugs of Abuse  No results found for: LABOPIA, COCAINSCRNUR, LABBENZ, AMPHETMU, THCU, LABBARB    Radiological Exams on Admission: DG Chest Portable 1 View  Result Date: 05/29/2020 CLINICAL DATA:  20 year old female with shortness of breath. EXAM: PORTABLE CHEST - 1 VIEW COMPARISON:  04/18/2020 FINDINGS: The mediastinal contours are within normal limits. No cardiomegaly. The lungs are clear bilaterally without evidence of focal consolidation, pleural effusion, or pneumothorax. No acute osseous abnormality. IMPRESSION: No acute cardiopulmonary process. Electronically Signed   By: Ruthann Cancer MD   On: 05/29/2020 14:27   US Abdomen Limited RUQ (LIVER/GB)  Result Date: 05/29/2020 CLINICAL DATA:  Increased LFTs. EXAM: ULTRASOUND ABDOMEN LIMITED RIGHT UPPER QUADRANT COMPARISON:  None. FINDINGS: Gallbladder: No gallstones or wall thickening visualized. No sonographic Murphy sign noted by sonographer. Common bile duct: Diameter: 2.0 mm Liver: No focal lesion identified. Hyperechoic  mass within the left lobe of the liver measures 2.8 x 3.0 x 1.1 cm. Portal vein is patent on color Doppler imaging with normal direction of blood flow towards the liver. Other: Question increased echogenicity of the right kidney. IMPRESSION: 1. 3 cm hyperechoic mass in the left lobe of the liver, likely representing a hemangioma or focal fatty infiltration. 2. Question increased echogenicity of the right kidney. Please correlate clinically. Electronically Signed   By: Fidela Salisbury M.D.   On: 05/29/2020 15:59    EKG: Personally reviewed by me which shows sinus tachycardia  Assessment/Plan Principal Problem:   DKA, type 1 ([place]) Active Problems:   DKA (diabetic ketoacidosis) ([place])   Hyponatremia   Type 1 diabetes mellitus with hyperglycemia ([facility])   Diabetic ketoacidosis.  Will admit the patient in the stepdown unit.  Telemetry monitoring.  Patient has history of type 1 diabetes.  Patient had been having some vomiting over the last few days.  We will continue oral antiemetics, n.p.o., IV fluids, insulin drip will be continued.  Continue dual drip until anion gap closes.  No obvious source of infection so far.  UA without any infection.  Abdomen exam was benign.  Right upper quadrant ultrasound did not show any acute findings but possible hemangioma.[place] of the chest showed no acute process.  Pregnancy test was negative.  Consult diabetic coordinator.  Patient would benefit from follow-up with endocrinology as outpatient in insulin pump.  Hyponatremia likely pseudohyponatremia.  Continue with volume resuscitation, insulin drip.  DVT Prophylaxis: Lovenox subcu  Consultant: None  Code Status: Full code  Microbiology none  Antibiotics: None  Family Communication:  Patients' condition and plan of care including tests being ordered have been discussed with the patient and the patient's mother on the phone who indicate understanding and agree with the plan.   Status is:  Observation  The patient remains OBS appropriate and will d/c before 2 midnights.  Dispo: The patient is from: Home              Anticipated d/c is to: Home  Patient currently is not medically stable to d/c.   Difficult to place patient No   Severity of Illness: The appropriate patient status for this patient is OBSERVATION. Observation status is judged to be reasonable and necessary in order to provide the required intensity of service to ensure the patient's safety. The patient's presenting symptoms, physical exam findings, and initial radiographic and laboratory data in the context of their medical condition is felt to place them at decreased risk for further clinical deterioration. Furthermore, it is anticipated that the patient will be medically stable for discharge from the hospital within 2 midnights of admission. T  Signed, Flora Lipps, MD [facility] 05/29/2020

## 2020-05-29 NOTE — ED Provider Notes (Addendum)
[facility] DEPT Provider Note   CSN: [id number] Arrival date & time: 05/29/20  1243    History Chief Complaint  Patient presents with  . Vomiting    Susan [name] is a 20 y.o. female with past medical history significant for type 1 diabetes who presents for evaluation of emesis and tachycardia.  Has had multiple episodes of NBNB emesis over the last 3 days.  Went to urgent care.  Noted to have blood sugars almost in the 400s as well as ketones in her urine.  Was admitted last year for DKA.  She is unsure if this feels similar.  Has been taking her blood sugars at home and they are normally well controlled hold in the high 100s, low 200's prior to her emesis.  She denies any suspicious food intake.  She denies headache, lightheadedness, dizziness, cough, chest pain, hemoptysis abdominal pain, diarrhea, dysuria.  Did states she felt slightly constipated and took a laxative.  She is passing flatulence.  No dysuria, hematuria, weakness.  Does have some shortness of breath when she feels like her heart is racing.  No tenderness to her lower extremities.  No history of PE, DVT, recent surgery, immobilization or malignancy.  Denies additional aggravating or alleviating factors.  History obtained from patient and past medical records.  No interpreter used  HPI     Past Medical History:  Diagnosis Date  . Diabetes [facility])     Patient Active Problem List   Diagnosis Date Noted  . DKA (diabetic ketoacidosis) ([place]) 04/18/2020  . Hyponatremia 04/18/2020  . Type 1 diabetes mellitus with hyperglycemia ([place]) 12/25/2018  . Hypokalemia 12/05/2018  . Hypophosphatemia 12/05/2018  . DKA, type 1 ([place]) 12/03/2018    History reviewed. No pertinent surgical history.   OB History   No obstetric history on file.     Family History  Problem Relation Age of Onset  . Diabetes Mother   . Diabetes Father     Social History   Tobacco Use  . Smoking status: Never  Smoker  . Smokeless tobacco: Never Used  Vaping Use  . Vaping Use: Never used  Substance Use Topics  . Alcohol use: Never  . Drug use: Never    Home Medications Prior to Admission medications   Medication Sig Start Date End Date Taking? Authorizing Provider  cetirizine (ZYRTEC) 10 MG tablet Take 10 mg by mouth daily.   Yes [provider]  insulin aspart (NOVOLOG) 100 UNIT/ML FlexPen For glucose 120 to 150 use 1 unit, for 151 to 200 use 2 units, for 201 to 250 use 3 units, for 251 to 300 use 5 units, for 301 to 350 use 7 units, for 351 to 400 use 9 units. Patient taking differently: Inject 0-9 Units into the skin 3 (three) times daily with meals. For glucose 120 to 150 use 1 unit, for 151 to 200 use 2 units, for 201 to 250 use 3 units, for 251 to 300 use 5 units, for 301 to 350 use 7 units, for 351 to 400 use 9 units. 04/19/20  Yes Arrien, Jimmy Picket, MD  insulin glargine (LANTUS) 100 UNIT/ML Solostar Pen Inject 15 Units into the skin daily. Patient taking differently: Inject 22 Units into the skin daily. 04/19/20 05/19/20 Yes Arrien, Jimmy Picket, MD  ketoconazole (NIZORAL) 2 % shampoo Apply 1 application topically every 14 (fourteen) days. 03/04/20  Yes [provider]  blood glucose meter kit and supplies Dispense based on patient and insurance preference.  Use up to four times daily as directed. (FOR ICD-10 E10.9, E11.9). 04/19/20   Arrien, Jimmy Picket, MD  insulin aspart (NOVOLOG) 100 UNIT/ML FlexPen Inject 5 Units into the skin 3 (three) times daily with meals. Patient not taking: Reported on 05/29/2020 04/19/20 05/19/20  Arrien, Jimmy Picket, MD  Insulin Pen Needle (PEN NEEDLES 3/16") 31G X 5 MM MISC 1 Dose by Does not apply route 4 (four) times daily - after meals and at bedtime. 04/19/20   Arrien, Jimmy Picket, MD    Allergies    Patient has no known allergies.  Review of Systems   Review of Systems  Constitutional: Positive for fatigue.  HENT: Negative.    Respiratory: Positive for shortness of breath. Negative for apnea, cough, choking, chest tightness, wheezing and stridor.   Cardiovascular: Negative for chest pain and leg swelling.  Gastrointestinal: Positive for nausea and vomiting. Negative for abdominal distention, abdominal pain, anal bleeding, blood in stool, constipation, diarrhea and rectal pain.  Genitourinary: Negative.   Musculoskeletal: Negative.   Skin: Negative.   Neurological: Negative.   All other systems reviewed and are negative.   Physical Exam Updated Vital Signs BP 135/66 (BP Location: Right Arm)   Pulse (!) 114   Temp 98.5 F (36.9 C) (Oral)   Resp (!) 23   Ht '5\' 7"'  (1.702 m)   Wt 95.3 kg   LMP 05/02/2020   SpO2 100%   BMI 32.89 kg/m   Physical Exam Vitals and nursing note reviewed.  Constitutional:      General: She is not in acute distress.    Appearance: She is well-developed. She is ill-appearing. She is not toxic-appearing or diaphoretic.  HENT:     Head: Normocephalic and atraumatic.     Nose: Nose normal.     Mouth/Throat:     Mouth: Mucous membranes are dry.  Eyes:     Pupils: Pupils are equal, round, and reactive to light.  Cardiovascular:     Rate and Rhythm: Tachycardia present.     Pulses: Normal pulses.          Radial pulses are 2+ on the right side and 2+ on the left side.       Dorsalis pedis pulses are 2+ on the right side and 2+ on the left side.     Heart sounds: Normal heart sounds.  Pulmonary:     Effort: Pulmonary effort is normal. Tachypnea present. No respiratory distress.     Breath sounds: Normal breath sounds.     Comments: Clear to auscultation bilaterally.  Speaks in full sentences. [place]ul respirations Abdominal:     General: Bowel sounds are normal. There is no distension.     Tenderness: There is no abdominal tenderness. There is no right CVA tenderness, left CVA tenderness, guarding or rebound.     Comments: Soft, nontender  Musculoskeletal:        General:  Normal range of motion.     Cervical back: Normal range of motion.     Comments: Moves all 4 extremities without difficulty.  Compartments soft.  Skin:    General: Skin is warm and dry.     Capillary Refill: Capillary refill takes less than 2 seconds.  Neurological:     General: No focal deficit present.     Mental Status: She is alert and oriented to person, place, and time. Mental status is at baseline.     ED Results / Procedures / Treatments   Labs (all labs ordered are  listed, but only abnormal results are displayed) Labs Reviewed  CBC WITH DIFFERENTIAL/PLATELET - Abnormal; Notable for the following components:      Result Value   RBC 5.42 (*)    Hemoglobin 16.3 (*)    HCT 50.6 (*)    Eosinophils Absolute 0.6 (*)    All other components within normal limits  COMPREHENSIVE METABOLIC PANEL - Abnormal; Notable for the following components:   Sodium 130 (*)    CO2 8 (*)    Glucose, Bld 377 (*)    Creatinine, Ser 1.09 (*)    Total Protein 9.9 (*)    Alkaline Phosphatase 172 (*)    Anion gap 20 (*)    All other components within normal limits  LIPASE, BLOOD - Abnormal; Notable for the following components:   Lipase 56 (*)    All other components within normal limits  URINALYSIS, ROUTINE W REFLEX MICROSCOPIC - Abnormal; Notable for the following components:   Color, Urine STRAW (*)    Glucose, UA >=500 (*)    Ketones, ur 80 (*)    Protein, ur 100 (*)    All other components within normal limits  BETA-HYDROXYBUTYRIC ACID - Abnormal; Notable for the following components:   Beta-Hydroxybutyric Acid >8.00 (*)    All other components within normal limits  BLOOD GAS, VENOUS - Abnormal; Notable for the following components:   pH, Ven 7.114 (*)    pCO2, Ven 22.8 (*)    Bicarbonate 7.0 (*)    Acid-base deficit 22.1 (*)    All other components within normal limits  BASIC METABOLIC PANEL - Abnormal; Notable for the following components:   Sodium 130 (*)    CO2 7 (*)     Glucose, Bld 311 (*)    Calcium 8.5 (*)    Anion gap 17 (*)    All other components within normal limits  CBG MONITORING, ED - Abnormal; Notable for the following components:   Glucose-Capillary 387 (*)    All other components within normal limits  I-STAT CHEM 8, ED - Abnormal; Notable for the following components:   Sodium 132 (*)    Glucose, Bld 382 (*)    TCO2 10 (*)    Hemoglobin 18.0 (*)    HCT 53.0 (*)    All other components within normal limits  CBG MONITORING, ED - Abnormal; Notable for the following components:   Glucose-Capillary 303 (*)    All other components within normal limits  CBG MONITORING, ED - Abnormal; Notable for the following components:   Glucose-Capillary 316 (*)    All other components within normal limits  RESP PANEL BY RT-PCR (FLU A&B, COVID) ARPGX2  MAGNESIUM  BASIC METABOLIC PANEL  BASIC METABOLIC PANEL  BASIC METABOLIC PANEL  I-STAT BETA HCG BLOOD, ED ([facility], [facility], [facility] ONLY)    EKG EKG Interpretation  Date/Time:  Sunday May 29 2020 13:17:04 EDT Ventricular Rate:  136 PR Interval:  89 QRS Duration: 87 QT Interval:  363 QTC Calculation: 547 R Axis:   81 Text Interpretation: Sinus tachycardia Consider right atrial enlargement Abnormal T, consider ischemia, diffuse leads Prolonged QT interval No acute changes prolonged QTc Confirmed by Varney Biles ([id number]) on 05/29/2020 2:53:29 PM   Radiology DG Chest Portable 1 View  Result Date: 05/29/2020 CLINICAL DATA:  20 year old female with shortness of breath. EXAM: PORTABLE CHEST - 1 VIEW COMPARISON:  04/18/2020 FINDINGS: The mediastinal contours are within normal limits. No cardiomegaly. The lungs are clear bilaterally without evidence of focal consolidation,  pleural effusion, or pneumothorax. No acute osseous abnormality. IMPRESSION: No acute cardiopulmonary process. Electronically Signed   By: Ruthann Cancer MD   On: 05/29/2020 14:27   US Abdomen Limited RUQ (LIVER/GB)  Result Date:  05/29/2020 CLINICAL DATA:  Increased LFTs. EXAM: ULTRASOUND ABDOMEN LIMITED RIGHT UPPER QUADRANT COMPARISON:  None. FINDINGS: Gallbladder: No gallstones or wall thickening visualized. No sonographic Murphy sign noted by sonographer. Common bile duct: Diameter: 2.0 mm Liver: No focal lesion identified. Hyperechoic mass within the left lobe of the liver measures 2.8 x 3.0 x 1.1 cm. Portal vein is patent on color Doppler imaging with normal direction of blood flow towards the liver. Other: Question increased echogenicity of the right kidney. IMPRESSION: 1. 3 cm hyperechoic mass in the left lobe of the liver, likely representing a hemangioma or focal fatty infiltration. 2. Question increased echogenicity of the right kidney. Please correlate clinically. Electronically Signed   By: Fidela Salisbury M.D.   On: 05/29/2020 15:59    Procedures .Critical Care Performed by: Nettie Elm, PA-C Authorized by: Nettie Elm, PA-C   Critical care provider statement:    Critical care time (minutes):  45   Critical care was necessary to treat or prevent imminent or life-threatening deterioration of the following conditions:  Endocrine crisis   Critical care was time spent personally by me on the following activities:  Discussions with consultants, evaluation of patient's response to treatment, examination of patient, ordering and performing treatments and interventions, ordering and review of laboratory studies, ordering and review of radiographic studies, pulse oximetry, re-evaluation of patient's condition, obtaining history from patient or surrogate and review of old charts     Medications Ordered in ED Medications  insulin regular, human (MYXREDLIN) 100 units/ 100 mL infusion (19 Units/hr Intravenous New Bag/Given 05/29/20 1629)  lactated ringers infusion ( Intravenous New Bag/Given 05/29/20 1453)  dextrose 5 % in lactated ringers infusion (has no administration in time range)  dextrose 50 %  solution 0-50 mL (has no administration in time range)  sodium chloride 0.9 % bolus 2,000 mL (0 mLs Intravenous Stopped 05/29/20 1435)  ondansetron (ZOFRAN) injection 4 mg (4 mg Intravenous Given 05/29/20 1336)  lactated ringers bolus 1,906 mL (1,906 mLs Intravenous New Bag/Given 05/29/20 1629)  potassium chloride 10 mEq in 100 mL IVPB (10 mEq Intravenous New Bag/Given 05/29/20 1603)    ED Course  I have reviewed the triage vital signs and the nursing notes.  Pertinent labs & imaging results that were available during my care of the patient were reviewed by me and considered in my medical decision making (see chart for details).  20 year old, type I diabetic presents for evaluation of possible DKA.  Multiple episodes of NBNB emesis over the last 3 days.  She is afebrile, nonseptic appearing however does appear ill.  She is tachycardic.  Apparently went to urgent care was noted to have blood sugars almost at 400 as well as ketonuria.  Patient heart and lungs clear.  She has no unilateral leg swelling, redness or warmth.  She has no clinical evidence of DVT on exam.  No cough, chest pain.  Her abdomen is soft, nontender.  We will plan on labs, imaging, reassess.  We will start with generous fluid bolus as  High clinical suspicion for DKA.  Labs and Imaging personally reviewed and interpreted:  CBC without leukocytosis, hemoglobin 16.3, high suspicion for hemoconcentration Metabolic panel sodium 413, likely pseudohyponatremia in onset of hyperglycemia, alk phos 172, normal T bili, anion gap  20 Lipase 56 Covid, flu negative VBG with pH 7.114, bicarb 7.0, acid base 22.1 DG chest without acute infiltrates, cardiomegaly, pulmonary edema, pneumothorax Betahydroxy>8.00 EKG with sinus tachycardia UA negative for infection, does have ketonuria.  Patient reassessed. No pain. No emesis since zofran. Discussed labs and imaging. Agreeable for admission.  Low suspicion for acute ACS, PE, dissection,  pneumothorax, bacterial infectious process as cause of her tachycardia.  Patient critically ill in DKA.  Has been started with IV fluids, insulin drip.  Will be admitted to hospitalist service for further work-up.  Discussed with attending Dr. Kathrynn Humble who is in agreement with above treatment, plan and disposition.  CONSULT with Dr. Louanne Belton with [facility] who is in agreeable to evaluate patient for admission.  The patient appears reasonably stabilized for admission considering the current resources, flow, and capabilities available in the ED at this time, and I doubt any other [facility] requiring further screening and/or treatment in the ED prior to admission.    MDM Rules/Calculators/A&P                           Final Clinical Impression(s) / ED Diagnoses Final diagnoses:  Elevated LFTs  Diabetic ketoacidosis without coma associated with type 1 diabetes mellitus (HCC)  Non-intractable vomiting with nausea, unspecified vomiting type    Rx / DC Orders ED Discharge Orders    None           Ciarrah Rae A, PA-C 05/29/20 [place], Ankit, MD 05/31/20 2036

## 2020-05-29 NOTE — ED Triage Notes (Addendum)
Pt arrived via walk in, c/o vomiting x3 days, was seen at urgent care and told to come to ED due to ketones in urine. Pt endorsing som SOB during triage, HR 140's-160's in triage, verified by palp.

## 2020-05-30 DIAGNOSIS — Z794 Long term (current) use of insulin: Secondary | ICD-10-CM | POA: Diagnosis not present

## 2020-05-30 DIAGNOSIS — Z79899 Other long term (current) drug therapy: Secondary | ICD-10-CM | POA: Diagnosis not present

## 2020-05-30 DIAGNOSIS — E871 Hypo-osmolality and hyponatremia: Secondary | ICD-10-CM | POA: Diagnosis present

## 2020-05-30 DIAGNOSIS — E876 Hypokalemia: Secondary | ICD-10-CM | POA: Diagnosis present

## 2020-05-30 DIAGNOSIS — E669 Obesity, unspecified: Secondary | ICD-10-CM | POA: Diagnosis present

## 2020-05-30 DIAGNOSIS — E1010 Type 1 diabetes mellitus with ketoacidosis without coma: Secondary | ICD-10-CM | POA: Diagnosis present

## 2020-05-30 DIAGNOSIS — Z6832 Body mass index (BMI) 32.0-32.9, adult: Secondary | ICD-10-CM | POA: Diagnosis not present

## 2020-05-30 DIAGNOSIS — Z20822 Contact with and (suspected) exposure to covid-19: Secondary | ICD-10-CM | POA: Diagnosis present

## 2020-05-30 DIAGNOSIS — Z833 Family history of diabetes mellitus: Secondary | ICD-10-CM | POA: Diagnosis not present

## 2020-05-30 DIAGNOSIS — E1065 Type 1 diabetes mellitus with hyperglycemia: Secondary | ICD-10-CM

## 2020-05-30 DIAGNOSIS — E1110 Type 2 diabetes mellitus with ketoacidosis without coma: Secondary | ICD-10-CM | POA: Diagnosis present

## 2020-05-30 LAB — GLUCOSE, CAPILLARY
Glucose-Capillary: 116 mg/dL — ABNORMAL HIGH (ref 70–99)
Glucose-Capillary: 119 mg/dL — ABNORMAL HIGH (ref 70–99)
Glucose-Capillary: 120 mg/dL — ABNORMAL HIGH (ref 70–99)
Glucose-Capillary: 125 mg/dL — ABNORMAL HIGH (ref 70–99)
Glucose-Capillary: 126 mg/dL — ABNORMAL HIGH (ref 70–99)
Glucose-Capillary: 136 mg/dL — ABNORMAL HIGH (ref 70–99)
Glucose-Capillary: 138 mg/dL — ABNORMAL HIGH (ref 70–99)
Glucose-Capillary: 147 mg/dL — ABNORMAL HIGH (ref 70–99)
Glucose-Capillary: 148 mg/dL — ABNORMAL HIGH (ref 70–99)
Glucose-Capillary: 150 mg/dL — ABNORMAL HIGH (ref 70–99)
Glucose-Capillary: 162 mg/dL — ABNORMAL HIGH (ref 70–99)
Glucose-Capillary: 173 mg/dL — ABNORMAL HIGH (ref 70–99)
Glucose-Capillary: 181 mg/dL — ABNORMAL HIGH (ref 70–99)
Glucose-Capillary: 184 mg/dL — ABNORMAL HIGH (ref 70–99)
Glucose-Capillary: 204 mg/dL — ABNORMAL HIGH (ref 70–99)
Glucose-Capillary: 209 mg/dL — ABNORMAL HIGH (ref 70–99)
Glucose-Capillary: 331 mg/dL — ABNORMAL HIGH (ref 70–99)

## 2020-05-30 LAB — MAGNESIUM
Magnesium: 1.8 mg/dL (ref 1.7–2.4)
Magnesium: 1.8 mg/dL (ref 1.7–2.4)

## 2020-05-30 LAB — COMPREHENSIVE METABOLIC PANEL
ALT: 9 U/L (ref 0–44)
AST: 12 U/L — ABNORMAL LOW (ref 15–41)
Albumin: 3.8 g/dL (ref 3.5–5.0)
Alkaline Phosphatase: 124 U/L (ref 38–126)
Anion gap: 11 (ref 5–15)
BUN: 9 mg/dL (ref 6–20)
CO2: 12 mmol/L — ABNORMAL LOW (ref 22–32)
Calcium: 9.6 mg/dL (ref 8.9–10.3)
Chloride: 109 mmol/L (ref 98–111)
Creatinine, Ser: 0.73 mg/dL (ref 0.44–1.00)
GFR, Estimated: >60 mL/min (ref >60)
Glucose, Bld: 121 mg/dL — ABNORMAL HIGH (ref 70–99)
Potassium: 2.8 mmol/L — ABNORMAL LOW (ref 3.5–5.1)
Sodium: 132 mmol/L — ABNORMAL LOW (ref 135–145)
Total Bilirubin: 1.3 mg/dL — ABNORMAL HIGH (ref 0.3–1.2)
Total Protein: 7.4 g/dL (ref 6.5–8.1)

## 2020-05-30 LAB — BASIC METABOLIC PANEL
Anion gap: 14 (ref 5–15)
Anion gap: 8 (ref 5–15)
Anion gap: 8 (ref 5–15)
Anion gap: 9 (ref 5–15)
BUN: 9 mg/dL (ref 6–20)
BUN: 9 mg/dL (ref 6–20)
BUN: 8 mg/dL (ref 6–20)
BUN: 7 mg/dL (ref 6–20)
CO2: 10 mmol/L — ABNORMAL LOW (ref 22–32)
CO2: 16 mmol/L — ABNORMAL LOW (ref 22–32)
CO2: 17 mmol/L — ABNORMAL LOW (ref 22–32)
CO2: 18 mmol/L — ABNORMAL LOW (ref 22–32)
Calcium: 9.9 mg/dL (ref 8.9–10.3)
Calcium: 9.4 mg/dL (ref 8.9–10.3)
Calcium: 9.2 mg/dL (ref 8.9–10.3)
Calcium: 9.2 mg/dL (ref 8.9–10.3)
Chloride: 110 mmol/L (ref 98–111)
Chloride: 109 mmol/L (ref 98–111)
Chloride: 109 mmol/L (ref 98–111)
Chloride: 105 mmol/L (ref 98–111)
Creatinine, Ser: 0.79 mg/dL (ref 0.44–1.00)
Creatinine, Ser: 0.68 mg/dL (ref 0.44–1.00)
Creatinine, Ser: 0.72 mg/dL (ref 0.44–1.00)
Creatinine, Ser: 0.64 mg/dL (ref 0.44–1.00)
GFR, Estimated: >60 mL/min (ref >60)
GFR, Estimated: >60 mL/min (ref >60)
GFR, Estimated: >60 mL/min (ref >60)
GFR, Estimated: >60 mL/min (ref >60)
Glucose, Bld: 130 mg/dL — ABNORMAL HIGH (ref 70–99)
Glucose, Bld: 154 mg/dL — ABNORMAL HIGH (ref 70–99)
Glucose, Bld: 211 mg/dL — ABNORMAL HIGH (ref 70–99)
Glucose, Bld: 148 mg/dL — ABNORMAL HIGH (ref 70–99)
Potassium: 3.4 mmol/L — ABNORMAL LOW (ref 3.5–5.1)
Potassium: 2.7 mmol/L — CL (ref 3.5–5.1)
Potassium: 3.3 mmol/L — ABNORMAL LOW (ref 3.5–5.1)
Potassium: 3 mmol/L — ABNORMAL LOW (ref 3.5–5.1)
Sodium: 134 mmol/L — ABNORMAL LOW (ref 135–145)
Sodium: 133 mmol/L — ABNORMAL LOW (ref 135–145)
Sodium: 134 mmol/L — ABNORMAL LOW (ref 135–145)
Sodium: 132 mmol/L — ABNORMAL LOW (ref 135–145)

## 2020-05-30 LAB — CBC
HCT: 41.8 % (ref 36.0–46.0)
Hemoglobin: 13.9 g/dL (ref 12.0–15.0)
MCH: 30.3 pg (ref 26.0–34.0)
MCHC: 33.3 g/dL (ref 30.0–36.0)
MCV: 91.1 fL (ref 80.0–100.0)
Platelets: 185 10*3/uL (ref 150–400)
RBC: 4.59 MIL/uL (ref 3.87–5.11)
RDW: 14.2 % (ref 11.5–15.5)
WBC: 4.6 10*3/uL (ref 4.0–10.5)
nRBC: 0 % (ref 0.0–0.2)

## 2020-05-30 LAB — PHOSPHORUS
Phosphorus: <1 mg/dL — CL (ref 2.5–4.6)
Phosphorus: 1.8 mg/dL — ABNORMAL LOW (ref 2.5–4.6)

## 2020-05-30 LAB — BETA-HYDROXYBUTYRIC ACID: Beta-Hydroxybutyric Acid: 1.58 mmol/L — ABNORMAL HIGH (ref 0.05–0.27)

## 2020-05-30 MED ORDER — INSULIN GLARGINE 100 UNIT/ML ~~LOC~~ SOLN
15.0000 [IU] | Freq: Every day | SUBCUTANEOUS | Status: DC
  Administered 2020-05-30: 15 [IU] via SUBCUTANEOUS
  Filled 2020-05-30 (×2): qty 0.15

## 2020-05-30 MED ORDER — INSULIN ASPART 100 UNIT/ML ~~LOC~~ SOLN
0.0000 [IU] | Freq: Every day | SUBCUTANEOUS | Status: DC
  Administered 2020-05-30: 4 [IU] via SUBCUTANEOUS
  Administered 2020-05-31: 2 [IU] via SUBCUTANEOUS

## 2020-05-30 MED ORDER — INSULIN ASPART 100 UNIT/ML ~~LOC~~ SOLN
0.0000 [IU] | Freq: Three times a day (TID) | SUBCUTANEOUS | Status: DC
  Administered 2020-05-31: 3 [IU] via SUBCUTANEOUS
  Administered 2020-05-31: 5 [IU] via SUBCUTANEOUS
  Administered 2020-05-31: 9 [IU] via SUBCUTANEOUS
  Administered 2020-06-01: 3 [IU] via SUBCUTANEOUS
  Administered 2020-06-01: 2 [IU] via SUBCUTANEOUS

## 2020-05-30 MED ORDER — INSULIN ASPART 100 UNIT/ML ~~LOC~~ SOLN
3.0000 [IU] | Freq: Three times a day (TID) | SUBCUTANEOUS | Status: DC
  Administered 2020-05-31 – 2020-06-01 (×5): 3 [IU] via SUBCUTANEOUS

## 2020-05-30 MED ORDER — K PHOS MONO-SOD PHOS DI & MONO 155-852-130 MG PO TABS
500.0000 mg | ORAL_TABLET | Freq: Three times a day (TID) | ORAL | Status: DC
  Administered 2020-05-30 – 2020-06-01 (×6): 500 mg via ORAL
  Filled 2020-05-30 (×8): qty 2

## 2020-05-30 MED ORDER — POTASSIUM CHLORIDE 10 MEQ/100ML IV SOLN
10.0000 meq | INTRAVENOUS | Status: AC
  Administered 2020-05-30 (×4): 10 meq via INTRAVENOUS
  Filled 2020-05-30 (×4): qty 100

## 2020-05-30 MED ORDER — POTASSIUM CHLORIDE CRYS ER 20 MEQ PO TBCR
40.0000 meq | EXTENDED_RELEASE_TABLET | Freq: Two times a day (BID) | ORAL | Status: DC
  Administered 2020-05-30 – 2020-06-01 (×4): 40 meq via ORAL
  Filled 2020-05-30 (×4): qty 2

## 2020-05-30 MED ORDER — SODIUM CHLORIDE 0.9 % IV SOLN
INTRAVENOUS | Status: DC | PRN
  Administered 2020-05-30: 500 mL via INTRAVENOUS

## 2020-05-30 MED ORDER — INSULIN GLARGINE 100 UNIT/ML ~~LOC~~ SOLN
22.0000 [IU] | Freq: Every day | SUBCUTANEOUS | Status: DC
  Administered 2020-05-31: 22 [IU] via SUBCUTANEOUS
  Filled 2020-05-30 (×2): qty 0.22

## 2020-05-30 MED ORDER — POTASSIUM PHOSPHATES 15 MMOLE/5ML IV SOLN
30.0000 mmol | Freq: Once | INTRAVENOUS | Status: AC
  Administered 2020-05-30: 30 mmol via INTRAVENOUS
  Filled 2020-05-30: qty 10

## 2020-05-30 NOTE — TOC Initial Note (Signed)
Transition of Care [facility]) - Initial/Assessment Note    Patient Details  Name: Susan [name] MRN: [id number] Date of Birth: 2000-03-27  Transition of Care [facility]) CM/SW Contact:    Golda Acre, RN Phone Number: 05/30/2020, 9:06 AM  Clinical Narrative:                 20 y.o. female with history of type 1 diabetes on insulin recently diagnosed in March 2022, presented hospital with complaints of vomiting for the last 3 days.  She was initially seen at urgent care center and was sent to the hospital due to ketones in urine.  Patient has been having elevated blood glucose levels for the last 2 to 3 days more than 300s.  In the urgent care blood glucose was almost 400.  Patient denied any dizziness, lightheadedness, cough, fever or chills.  Denies any abdominal pain diarrhea.  Denies any urinary urgency frequency or dysuria.  He was slightly constipated and had taken a laxative.  Patient also complained of mild shortness of breath and palpitations.  Patient complains of mild epigastric discomfort.  She did have history of feeling sick shortly after eating outside food.  Patient is in the process of following up with endocrinologist as outpatient.  She states that she has been using insulin as prescribed PLAN: to return to previous adl levels and independence. Expected Discharge Plan: Home/Self Care Barriers to Discharge: Continued Medical Work up   Patient Goals and CMS Choice Patient states their goals for this hospitalization and ongoing recovery are:: to go home CMS Medicare.gov Compare Post Acute Care list provided to:: Patient    Expected Discharge Plan and Services Expected Discharge Plan: Home/Self Care   Discharge Planning Services: CM Consult                                          Prior Living Arrangements/Services     Patient language and need for interpreter reviewed:: Yes Do you feel safe going back to the place where you live?: Yes            Criminal  Activity/Legal Involvement Pertinent to Current Situation/Hospitalization: No - Comment as needed  Activities of Daily Living Home Assistive Devices/Equipment: CBG Meter,Eyeglasses ADL Screening (condition at time of admission) Patient's cognitive ability adequate to safely complete daily activities?: Yes Is the patient deaf or have difficulty hearing?: No Does the patient have difficulty seeing, even when wearing glasses/contacts?: No Does the patient have difficulty concentrating, remembering, or making decisions?: No Patient able to express need for assistance with ADLs?: Yes Does the patient have difficulty dressing or bathing?: No Independently performs ADLs?: Yes (appropriate for developmental age) Communication: Independent Dressing (OT): Independent Grooming: Independent Feeding: Independent Bathing: Independent Toileting: Independent In/Out Bed: Independent Walks in Home: Independent Does the patient have difficulty walking or climbing stairs?: No Weakness of Legs: None Weakness of Arms/Hands: None  Permission Sought/Granted                  Emotional Assessment Appearance:: Appears stated age Attitude/Demeanor/Rapport: Engaged Affect (typically observed): Calm Orientation: : Oriented to Place,Oriented to Self,Oriented to  Time Alcohol / Substance Use: Not Applicable Psych Involvement: No (comment)  Admission diagnosis:  DKA (diabetic ketoacidosis) (HCC) [E11.10] Elevated LFTs [R79.89] Diabetic ketoacidosis without coma associated with type 1 diabetes mellitus (HCC) [E10.10] Non-intractable vomiting with nausea, unspecified vomiting type [R11.2] Patient Active Problem  List   Diagnosis Date Noted  . DKA (diabetic ketoacidosis) (HCC) 04/18/2020  . Hyponatremia 04/18/2020  . Type 1 diabetes mellitus with hyperglycemia (HCC) 12/25/2018  . Hypokalemia 12/05/2018  . Hypophosphatemia 12/05/2018  . DKA, type 1 (HCC) 12/03/2018   PCP:  System, Provider Not  In Pharmacy:   [id number] Ginette Otto, [place] - [address] AT [facility] [address] [place] [address] Phone: [phone] Fax: [phone]     Social Determinants of Health (SDOH) Interventions    Readmission Risk Interventions No flowsheet data found.

## 2020-05-30 NOTE — Progress Notes (Addendum)
PROGRESS NOTE  Susan [name] TJQ:[id number] DOB: [birth year] DOA: 05/29/2020 PCP: System, Provider Not In   LOS: 0 days   Brief narrative: Susan [name] is a 20 y.o. female with history of type 1 diabetes on insulin, recently diagnosed in March 2022, presented to the hospital with complaints of vomiting for the last 3 days.  She was initially seen at urgent care center and was sent to the hospital due to ketones in urine.  Patient has been having elevated blood glucose levels for the last 2 to 3 days more than 300s.  In the urgent care blood glucose was almost 400.    Patient stated that this was preceded by eating some bad strength with made her started throwing up.  In the ED, patient had Kussmaul breathing.  She was mildly tachycardic.  She had low bicarbonate on the BMP and anion gap was elevated.  Patient was diagnosed to be having diabetic ketoacidosis and was considered for admission to the hospital.  Initial sodium was 130, bicarb of 10 with blood glucose level of 377 and creatinine at 1.0.  Anion gap was 20.  Lipase was 56.  Urinalysis was positive for ketones and glucose more than 500.  Beta hydroxybutyrate acid was more than 8.  Initial venous pH was 7.1 in the ED, patient received Ringer lactate bolus 2 L, insulin drip was initiated.  Patient also received potassium chloride as per protocol.  Patient was then admitted to the stepdown unit.  Assessment/Plan:  Principal Problem:   DKA, type 1 (HCC) Active Problems:   Hypokalemia   Hypophosphatemia   DKA (diabetic ketoacidosis) (HCC)   Hyponatremia   Type 1 diabetes mellitus with hyperglycemia (HCC)  Diabetic ketoacidosis patient with type 1 diabetes.  Patient is currently receiving insulin drip.  Will change to Lantus and sliding scale insulin today.  She did have multiple electrolyte issues which has been aggressively replenished.  Continue IV fluid hydration.  Replenish electrolytes as necessary.  Hypokalemia and hypophosphatemia.   Received multiple doses of IV KCl and IV K-Phos.  Currently receiving supplements.  We will continue to monitor closely.  Continue to check BMP every 4 hourly.  Mild hyponatremia.  Likely pseudohyponatremia.  Improving with improvement in blood glucose levels.  DVT prophylaxis: Place and maintain sequential compression device Start: 05/30/20 1033 enoxaparin (LOVENOX) injection 40 mg Start: 05/29/20 2200   Code Status: Full code  Family Communication:  Spoke with the patient's mother and updated her about the clinical condition of the patient.  Status is: Observation  The patient will require care spanning > 2 midnights and should be moved to inpatient because: Persistent severe electrolyte disturbances, IV treatments appropriate due to intensity of illness or inability to take PO and Inpatient level of care appropriate due to severity of illness  Dispo: The patient is from: Home              Anticipated d/c is to: Home              Patient currently is not medically stable to d/c.   Difficult to place patient No  Consultants:  None  Procedures:  None  Anti-infectives:  . None  Anti-infectives (From admission, onward)   None     Subjective: Today, patient was seen and examined at bedside.  Patient denies any nausea vomiting or abdominal pain today.  Feels little better.  Still NPO.  Has been receiving multiple electrolytes.  Objective: Vitals:   05/30/20 1200 05/30/20 1300  BP:  133/76 127/75  Pulse:  96  Resp: 17 12  Temp: 98.4 F (36.9 C)   SpO2:  99%    Intake/Output Summary (Last 24 hours) at 05/30/2020 1534 Last data filed at 05/30/2020 1427 Gross per 24 hour  Intake 5676.75 ml  Output 450 ml  Net 5226.75 ml   Filed Weights   05/29/20 1503 05/29/20 1815  Weight: 95.3 kg 84.4 kg   Body mass index is 30.03 kg/m.   Physical Exam: GENERAL: Patient is alert awake and oriented. Not in obvious distress.  Obese HENT: No scleral pallor or icterus. Pupils  equally reactive to light. Oral mucosa is moist NECK: is supple, no gross swelling noted. CHEST: Clear to auscultation. No crackles or wheezes.  Diminished breath sounds bilaterally. CVS: S1 and S2 heard, no murmur. Regular rate and rhythm.  ABDOMEN: Soft, non-tender, bowel sounds are present. EXTREMITIES: No edema. CNS: Cranial nerves are intact. No focal motor deficits. SKIN: warm and dry without rashes.  Data Review: I have personally reviewed the following laboratory data and studies,  CBC: Recent Labs  Lab 05/29/20 1310 05/29/20 1329 05/30/20 0254  WBC 6.3  --  4.6  NEUTROABS 4.0  --   --   HGB 16.3* 18.0* 13.9  HCT 50.6* 53.0* 41.8  MCV 93.4  --  91.1  PLT 240  --  185   Basic Metabolic Panel: Recent Labs  Lab 05/29/20 1403 05/29/20 1852 05/29/20 2246 05/30/20 0254 05/30/20 0757 05/30/20 1142  NA 130* 134* 134* 132* 133* 134*  K 4.8 3.6 3.4* 2.8* 2.7* 3.3*  CL 106 112* 110 109 109 109  CO2 7* PENDING 10* 12* 16* 17*  GLUCOSE 311* 161* 130* 121* 154* 211*  BUN 12 PENDING 9 9 9 8   CREATININE 1.00 PENDING 0.79 0.73 0.68 0.72  CALCIUM 8.5* 9.2 9.9 9.6 9.4 9.2  MG 2.2  --   --  1.8  --  1.8  PHOS  --   --   --  <1.0*  --  1.8*   Liver Function Tests: Recent Labs  Lab 05/29/20 1310 05/30/20 0254  AST 15 12*  ALT 9 9  ALKPHOS 172* 124  BILITOT 1.2 1.3*  PROT 9.9* 7.4  ALBUMIN 4.9 3.8   Recent Labs  Lab 05/29/20 1310  LIPASE 56*   No results for input(s): AMMONIA in the last 168 hours. Cardiac Enzymes: No results for input(s): CKTOTAL, CKMB, CKMBINDEX, TROPONINI in the last 168 hours. BNP (last 3 results) No results for input(s): BNP in the last 8760 hours.  ProBNP (last 3 results) No results for input(s): PROBNP in the last 8760 hours.  CBG: Recent Labs  Lab 05/30/20 1008 05/30/20 1114 05/30/20 1219 05/30/20 1343 05/30/20 1431  GLUCAP 184* 204* 209* 125* 120*   Recent Results (from the past 240 hour(s))  Resp Panel by RT-PCR (Flu A&B,  Covid) Nasopharyngeal Swab     Status: None   Collection Time: 05/29/20  1:15 PM   Specimen: Nasopharyngeal Swab; Nasopharyngeal(NP) swabs in vial transport medium  Result Value Ref Range Status   SARS Coronavirus 2 by RT PCR NEGATIVE NEGATIVE Final    Comment: (NOTE) SARS-CoV-2 target nucleic acids are NOT DETECTED.  The SARS-CoV-2 RNA is generally detectable in upper respiratory specimens during the acute phase of infection. The lowest concentration of SARS-CoV-2 viral copies this assay can detect is 138 copies/mL. A negative result does not preclude SARS-Cov-2 infection and should not be used as the sole basis for treatment or  other patient management decisions. A negative result may occur with  improper specimen collection/handling, submission of specimen other than nasopharyngeal swab, presence of viral mutation(s) within the areas targeted by this assay, and inadequate number of viral copies(<138 copies/mL). A negative result must be combined with clinical observations, patient history, and epidemiological information. The expected result is Negative.  Fact Sheet for Patients:  BloggerCourse.comhttps://www.fda.gov/media/152166/download  Fact Sheet for Healthcare Providers:  SeriousBroker.ithttps://www.fda.gov/media/152162/download  This test is no t yet approved or cleared by the [place] FDA and  has been authorized for detection and/or diagnosis of SARS-CoV-2 by FDA under an Emergency Use Authorization (EUA). This EUA will remain  in effect (meaning this test can be used) for the duration of the COVID-19 declaration under Section 564(b)(1) of the Act, 21 U.S.C.section 360bbb-3(b)(1), unless the authorization is terminated  or revoked sooner.       Influenza A by PCR NEGATIVE NEGATIVE Final   Influenza B by PCR NEGATIVE NEGATIVE Final    Comment: (NOTE) The Xpert Xpress SARS-CoV-2/FLU/RSV plus assay is intended as an aid in the diagnosis of influenza from Nasopharyngeal swab specimens and should  not be used as a sole basis for treatment. Nasal washings and aspirates are unacceptable for Xpert Xpress SARS-CoV-2/FLU/RSV testing.  Fact Sheet for Patients: BloggerCourse.comhttps://www.fda.gov/media/152166/download  Fact Sheet for Healthcare Providers: SeriousBroker.ithttps://www.fda.gov/media/152162/download  This test is not yet approved or cleared by the [place] FDA and has been authorized for detection and/or diagnosis of SARS-CoV-2 by FDA under an Emergency Use Authorization (EUA). This EUA will remain in effect (meaning this test can be used) for the duration of the COVID-19 declaration under Section 564(b)(1) of the Act, 21 U.S.C. section 360bbb-3(b)(1), unless the authorization is terminated or revoked.  Performed at [facility], [address] [place], [place] [address]   MRSA PCR Screening     Status: None   Collection Time: 05/29/20  5:54 PM   Specimen: Nasal Mucosa; Nasopharyngeal  Result Value Ref Range Status   MRSA by PCR NEGATIVE NEGATIVE Final    Comment:        The GeneXpert MRSA Assay (FDA approved for NASAL specimens only), is one component of a comprehensive MRSA colonization surveillance program. It is not intended to diagnose MRSA infection nor to guide or monitor treatment for MRSA infections. Performed at [facility], [address] [place], [place] [address]      Studies: DG Chest Portable 1 View  Result Date: 05/29/2020 CLINICAL DATA:  20 year old female with shortness of breath. EXAM: PORTABLE CHEST - 1 VIEW COMPARISON:  04/18/2020 FINDINGS: The mediastinal contours are within normal limits. No cardiomegaly. The lungs are clear bilaterally without evidence of focal consolidation, pleural effusion, or pneumothorax. No acute osseous abnormality. IMPRESSION: No acute cardiopulmonary process. Electronically Signed   By: Marliss Cootsylan  Suttle MD   On: 05/29/2020 14:27   US Abdomen Limited RUQ (LIVER/GB)  Result Date: 05/29/2020 CLINICAL  DATA:  Increased LFTs. EXAM: ULTRASOUND ABDOMEN LIMITED RIGHT UPPER QUADRANT COMPARISON:  None. FINDINGS: Gallbladder: No gallstones or wall thickening visualized. No sonographic Murphy sign noted by sonographer. Common bile duct: Diameter: 2.0 mm Liver: No focal lesion identified. Hyperechoic mass within the left lobe of the liver measures 2.8 x 3.0 x 1.1 cm. Portal vein is patent on color Doppler imaging with normal direction of blood flow towards the liver. Other: Question increased echogenicity of the right kidney. IMPRESSION: 1. 3 cm hyperechoic mass in the left lobe of the liver, likely representing a  hemangioma or focal fatty infiltration. 2. Question increased echogenicity of the right kidney. Please correlate clinically. Electronically Signed   By: Ted Mcalpine M.D.   On: 05/29/2020 15:59      Joycelyn Das, MD  [facility] 05/30/2020  If 7PM-7AM, please contact night-coverage

## 2020-05-30 NOTE — Progress Notes (Addendum)
Inpatient Diabetes Program Recommendations  AACE/ADA: New Consensus Statement on Inpatient Glycemic Control (2015)  Target Ranges:  Prepandial:   less than 140 mg/dL      Peak postprandial:   less than 180 mg/dL (1-2 hours)      Critically ill patients:  140 - 180 mg/dL   Lab Results  Component Value Date   GLUCAP 125 (H) 05/30/2020   HGBA1C 10.3 (H) 04/18/2020    Review of Glycemic Control  Diabetes history: DM1 Outpatient Diabetes medications: Lantus 22 units QD, Novolog 0-9 units TID with meals Current orders for Inpatient glycemic control: IV insulin per End[employer] for DKA  HgbA1C - 10.3% Last spoke with pt 10 days ago.  Inpatient Diabetes Program Recommendations:     See pt this afternoon. Not ready to transition off drip quite yet.  Follow closely.  Thank you. Ailene Ards, RD, LDN, CDE Inpatient Diabetes Coordinator [phone]  Addendum: Briefly spoke with pt at bedside about readmission. Pt sleepy, but said she went to [facility] for PCP and MD increased her Lantus to 22 units QHS. States she was doing well with her blood sugars until she ate some bad shrimp and got food poisoning. States she did not miss her insulin. Discussed sick day rules and pt states she did not have any questions. Has no problems in obtaining her insulin.  RV

## 2020-05-30 NOTE — Progress Notes (Signed)
Insulin gtt rate at 2 units/hr per MD order

## 2020-05-31 DIAGNOSIS — E1010 Type 1 diabetes mellitus with ketoacidosis without coma: Secondary | ICD-10-CM | POA: Diagnosis not present

## 2020-05-31 DIAGNOSIS — E876 Hypokalemia: Secondary | ICD-10-CM | POA: Diagnosis not present

## 2020-05-31 DIAGNOSIS — E871 Hypo-osmolality and hyponatremia: Secondary | ICD-10-CM | POA: Diagnosis not present

## 2020-05-31 LAB — BASIC METABOLIC PANEL
Anion gap: 11 (ref 5–15)
Anion gap: 12 (ref 5–15)
BUN: <5 mg/dL — ABNORMAL LOW (ref 6–20)
BUN: <5 mg/dL — ABNORMAL LOW (ref 6–20)
CO2: 22 mmol/L (ref 22–32)
CO2: 22 mmol/L (ref 22–32)
Calcium: 8.6 mg/dL — ABNORMAL LOW (ref 8.9–10.3)
Calcium: 8.7 mg/dL — ABNORMAL LOW (ref 8.9–10.3)
Chloride: 104 mmol/L (ref 98–111)
Chloride: 103 mmol/L (ref 98–111)
Creatinine, Ser: 0.76 mg/dL (ref 0.44–1.00)
Creatinine, Ser: 0.62 mg/dL (ref 0.44–1.00)
GFR, Estimated: >60 mL/min (ref >60)
GFR, Estimated: >60 mL/min (ref >60)
Glucose, Bld: 332 mg/dL — ABNORMAL HIGH (ref 70–99)
Glucose, Bld: 317 mg/dL — ABNORMAL HIGH (ref 70–99)
Potassium: 3.5 mmol/L (ref 3.5–5.1)
Potassium: 3.2 mmol/L — ABNORMAL LOW (ref 3.5–5.1)
Sodium: 137 mmol/L (ref 135–145)
Sodium: 137 mmol/L (ref 135–145)

## 2020-05-31 LAB — CBC
HCT: 36 % (ref 36.0–46.0)
Hemoglobin: 12.4 g/dL (ref 12.0–15.0)
MCH: 31 pg (ref 26.0–34.0)
MCHC: 34.4 g/dL (ref 30.0–36.0)
MCV: 90 fL (ref 80.0–100.0)
Platelets: 156 10*3/uL (ref 150–400)
RBC: 4 MIL/uL (ref 3.87–5.11)
RDW: 14 % (ref 11.5–15.5)
WBC: 3.3 10*3/uL — ABNORMAL LOW (ref 4.0–10.5)
nRBC: 0 % (ref 0.0–0.2)

## 2020-05-31 LAB — COMPREHENSIVE METABOLIC PANEL
ALT: 8 U/L (ref 0–44)
AST: 13 U/L — ABNORMAL LOW (ref 15–41)
Albumin: 3.2 g/dL — ABNORMAL LOW (ref 3.5–5.0)
Alkaline Phosphatase: 109 U/L (ref 38–126)
Anion gap: 13 (ref 5–15)
BUN: 5 mg/dL — ABNORMAL LOW (ref 6–20)
CO2: 17 mmol/L — ABNORMAL LOW (ref 22–32)
Calcium: 8.7 mg/dL — ABNORMAL LOW (ref 8.9–10.3)
Chloride: 102 mmol/L (ref 98–111)
Creatinine, Ser: 0.61 mg/dL (ref 0.44–1.00)
GFR, Estimated: >60 mL/min (ref >60)
Glucose, Bld: 246 mg/dL — ABNORMAL HIGH (ref 70–99)
Potassium: 2.6 mmol/L — CL (ref 3.5–5.1)
Sodium: 132 mmol/L — ABNORMAL LOW (ref 135–145)
Total Bilirubin: 1 mg/dL (ref 0.3–1.2)
Total Protein: 6.4 g/dL — ABNORMAL LOW (ref 6.5–8.1)

## 2020-05-31 LAB — GLUCOSE, CAPILLARY
Glucose-Capillary: 272 mg/dL — ABNORMAL HIGH (ref 70–99)
Glucose-Capillary: 357 mg/dL — ABNORMAL HIGH (ref 70–99)
Glucose-Capillary: 217 mg/dL — ABNORMAL HIGH (ref 70–99)
Glucose-Capillary: 229 mg/dL — ABNORMAL HIGH (ref 70–99)

## 2020-05-31 LAB — MAGNESIUM
Magnesium: 1.7 mg/dL (ref 1.7–2.4)
Magnesium: 1.9 mg/dL (ref 1.7–2.4)

## 2020-05-31 LAB — PHOSPHORUS
Phosphorus: 1 mg/dL — CL (ref 2.5–4.6)
Phosphorus: 3.2 mg/dL (ref 2.5–4.6)
Phosphorus: 2.3 mg/dL — ABNORMAL LOW (ref 2.5–4.6)

## 2020-05-31 MED ORDER — POTASSIUM PHOSPHATES 15 MMOLE/5ML IV SOLN
45.0000 mmol | Freq: Once | INTRAVENOUS | Status: AC
  Administered 2020-05-31: 45 mmol via INTRAVENOUS
  Filled 2020-05-31: qty 15

## 2020-05-31 MED ORDER — POTASSIUM CHLORIDE CRYS ER 20 MEQ PO TBCR
40.0000 meq | EXTENDED_RELEASE_TABLET | Freq: Once | ORAL | Status: AC
  Administered 2020-05-31: 40 meq via ORAL
  Filled 2020-05-31: qty 2

## 2020-05-31 MED ORDER — MAGNESIUM SULFATE 2 GM/50ML IV SOLN
2.0000 g | Freq: Once | INTRAVENOUS | Status: AC
  Administered 2020-05-31: 2 g via INTRAVENOUS
  Filled 2020-05-31: qty 50

## 2020-05-31 MED ORDER — MAGNESIUM OXIDE 400 (241.3 MG) MG PO TABS
400.0000 mg | ORAL_TABLET | Freq: Two times a day (BID) | ORAL | Status: DC
  Administered 2020-05-31 – 2020-06-01 (×3): 400 mg via ORAL
  Filled 2020-05-31 (×2): qty 1

## 2020-05-31 MED ORDER — POTASSIUM CHLORIDE 10 MEQ/100ML IV SOLN
10.0000 meq | INTRAVENOUS | Status: AC
  Administered 2020-05-31 (×4): 10 meq via INTRAVENOUS
  Filled 2020-05-31 (×4): qty 100

## 2020-05-31 NOTE — Progress Notes (Signed)
CRITICAL VALUE STICKER  CRITICAL VALUE:Potassium 2.6 Phosphorus <1.0  DATE & TIME NOTIFIED: 05/31/2020 4:18AM  MESSENGER (Chemistry):  MD NOTIFIED: Dr.Girguis  TIME OF NOTIFICATION:4:17AM  RESPONSE: Waiting

## 2020-05-31 NOTE — Progress Notes (Signed)
PROGRESS NOTE  Susan [name] BJY:[id number] DOB: 12/12/2000 DOA: 05/29/2020 PCP: System, Provider Not In   LOS: 1 day   Brief narrative: Susan [name] is a 20 y.o. female with history of type 1 diabetes on insulin, recently diagnosed in March 2022, presented to the hospital with complaints of vomiting for the last 3 days.  She was initially seen at urgent care center and was sent to the hospital due to ketones in urine.  Patient has been having elevated blood glucose levels for the last 2 to 3 days more than 300s.  In the urgent care blood glucose was almost 400.    Patient stated that this was preceded by eating some bad strength with made her started throwing up.  In the ED, patient had Kussmaul breathing.  She was mildly tachycardic.  She had low bicarbonate on the BMP and anion gap was elevated.  Patient was diagnosed to be having diabetic ketoacidosis and was considered for admission to the hospital.  Initial sodium was 130, bicarb of 10 with blood glucose level of 377 and creatinine at 1.0.  Anion gap was 20.  Lipase was 56.  Urinalysis was positive for ketones and glucose more than 500.  Beta hydroxybutyrate acid was more than 8.  Initial venous pH was 7.1 in the ED, patient received Ringer lactate bolus 2 L, insulin drip was initiated.  Patient also received potassium chloride as per protocol.  Patient was then admitted to the stepdown unit.  Assessment/Plan:  Principal Problem:   DKA, type 1 (HCC) Active Problems:   Hypokalemia   Hypophosphatemia   DKA (diabetic ketoacidosis) (HCC)   Hyponatremia   Type 1 diabetes mellitus with hyperglycemia (HCC)  Diabetic ketoacidosis patient with type 1 diabetes.  Diabetic ketoacidosis resolved.  Off insulin drip.  Has been transitioned to Lantus sliding scale insulin in mealtime insulin.  Patient takes Lantus 22 units at bedtime.  Susan be resumed from today.  Continue IV hydration.  Has been transitioned to oral diet   Severe hypokalemia   Continue to replenish aggressively.  Received 2 g of magnesium sulfate this morning.  Magnesium of 1.7.  Despite multiple replacements still very low.  We Susan continue to replenish aggressively.  Check BMP at 12 PM.  Severe hypophosphatemia.  Received multiple doses  IV K-Phos.  Continue oral K-Phos.  Currently receiving 45 millibolus of potassium phosphate through the IV.  We Susan continue to monitor closely.    Mild hyponatremia.  Likely pseudohyponatremia.  Improving with improvement in blood glucose levels.  DVT prophylaxis: Place and maintain sequential compression device Start: 05/30/20 1033 enoxaparin (LOVENOX) injection 40 mg Start: 05/29/20 2200   Code Status: Full code  Family Communication:  Spoke with the patient's grandfather at bedside  Status is: Observation  The patient Susan require care spanning > 2 midnights and should be moved to inpatient because: Persistent severe electrolyte disturbances, IV treatments appropriate due to intensity of illness or inability to take PO and Inpatient level of care appropriate due to severity of illness  Dispo: The patient is from: Home              Anticipated d/c is to: Home likely by tomorrow              Patient currently is not medically stable to d/c.   Difficult to place patient No  Consultants:  None  Procedures:  None  Anti-infectives:  . None  Anti-infectives (From admission, onward)   None  Subjective: Today, patient was seen and examined at bedside.  Patient denies any nausea vomiting or abdominal pain.  Feels better.  Ambulating in the hallway.    Objective: Vitals:   05/31/20 0700 05/31/20 0800  BP: 125/65 (!) 106/55  Pulse: 97 93  Resp: 17 15  Temp:  98.8 F (37.1 C)  SpO2: 98% 98%    Intake/Output Summary (Last 24 hours) at 05/31/2020 1129 Last data filed at 05/31/2020 0800 Gross per 24 hour  Intake 5246.73 ml  Output 1500 ml  Net 3746.73 ml   Filed Weights   05/29/20 1503 05/29/20 1815   Weight: 95.3 kg 84.4 kg   Body mass index is 30.03 kg/m.   Physical Exam:  General: Obese built, not in obvious distress HENT:   No scleral pallor or icterus noted. Oral mucosa is moist.  Chest:  Clear breath sounds.  Diminished breath sounds bilaterally. No crackles or wheezes.  CVS: S1 &S2 heard. No murmur.  Regular rate and rhythm. Abdomen: Soft, nontender, nondistended.  Bowel sounds are heard.   Extremities: No cyanosis, clubbing or edema.  Peripheral pulses are palpable. Psych: Alert, awake and oriented, normal mood CNS:  No cranial nerve deficits.  Power equal in all extremities.   Skin: Warm and dry.  No rashes noted.   Data Review: I have personally reviewed the following laboratory data and studies,  CBC: Recent Labs  Lab 05/29/20 1310 05/29/20 1329 05/30/20 0254 05/31/20 0212  WBC 6.3  --  4.6 3.3*  NEUTROABS 4.0  --   --   --   HGB 16.3* 18.0* 13.9 12.4  HCT 50.6* 53.0* 41.8 36.0  MCV 93.4  --  91.1 90.0  PLT 240  --  185 156   Basic Metabolic Panel: Recent Labs  Lab 05/29/20 1403 05/29/20 1852 05/30/20 0254 05/30/20 0757 05/30/20 1142 05/30/20 1627 05/31/20 0212  NA 130*   < > 132* 133* 134* 132* 132*  K 4.8   < > 2.8* 2.7* 3.3* 3.0* 2.6*  CL 106   < > 109 109 109 105 102  CO2 7*   < > 12* 16* 17* 18* 17*  GLUCOSE 311*   < > 121* 154* 211* 148* 246*  BUN 12   < > 9 9 8 7  5*  CREATININE 1.00   < > 0.73 0.68 0.72 0.64 0.61  CALCIUM 8.5*   < > 9.6 9.4 9.2 9.2 8.7*  MG 2.2  --  1.8  --  1.8  --  1.7  PHOS  --   --  <1.0*  --  1.8*  --  1.0*   < > = values in this interval not displayed.   Liver Function Tests: Recent Labs  Lab 05/29/20 1310 05/30/20 0254 05/31/20 0212  AST 15 12* 13*  ALT 9 9 8   ALKPHOS 172* 124 109  BILITOT 1.2 1.3* 1.0  PROT 9.9* 7.4 6.4*  ALBUMIN 4.9 3.8 3.2*   Recent Labs  Lab 05/29/20 1310  LIPASE 56*   No results for input(s): AMMONIA in the last 168 hours. Cardiac Enzymes: No results for input(s): CKTOTAL,  CKMB, CKMBINDEX, TROPONINI in the last 168 hours. BNP (last 3 results) No results for input(s): BNP in the last 8760 hours.  ProBNP (last 3 results) No results for input(s): PROBNP in the last 8760 hours.  CBG: Recent Labs  Lab 05/30/20 1343 05/30/20 1431 05/30/20 1726 05/30/20 2135 05/31/20 0819  GLUCAP 125* 120* 147* 331* 272*   Recent Results (  from the past 240 hour(s))  Resp Panel by RT-PCR (Flu A&B, Covid) Nasopharyngeal Swab     Status: None   Collection Time: 05/29/20  1:15 PM   Specimen: Nasopharyngeal Swab; Nasopharyngeal(NP) swabs in vial transport medium  Result Value Ref Range Status   SARS Coronavirus 2 by RT PCR NEGATIVE NEGATIVE Final    Comment: (NOTE) SARS-CoV-2 target nucleic acids are NOT DETECTED.  The SARS-CoV-2 RNA is generally detectable in upper respiratory specimens during the acute phase of infection. The lowest concentration of SARS-CoV-2 viral copies this assay can detect is 138 copies/mL. A negative result does not preclude SARS-Cov-2 infection and should not be used as the sole basis for treatment or other patient management decisions. A negative result may occur with  improper specimen collection/handling, submission of specimen other than nasopharyngeal swab, presence of viral mutation(s) within the areas targeted by this assay, and inadequate number of viral copies(<138 copies/mL). A negative result must be combined with clinical observations, patient history, and epidemiological information. The expected result is Negative.  Fact Sheet for Patients:  BloggerCourse.com  Fact Sheet for Healthcare Providers:  SeriousBroker.it  This test is no t yet approved or cleared by the [place] FDA and  has been authorized for detection and/or diagnosis of SARS-CoV-2 by FDA under an Emergency Use Authorization (EUA). This EUA Susan remain  in effect (meaning this test can be used) for the duration  of the COVID-19 declaration under Section 564(b)(1) of the Act, 21 U.S.C.section 360bbb-3(b)(1), unless the authorization is terminated  or revoked sooner.       Influenza A by PCR NEGATIVE NEGATIVE Final   Influenza B by PCR NEGATIVE NEGATIVE Final    Comment: (NOTE) The Xpert Xpress SARS-CoV-2/FLU/RSV plus assay is intended as an aid in the diagnosis of influenza from Nasopharyngeal swab specimens and should not be used as a sole basis for treatment. Nasal washings and aspirates are unacceptable for Xpert Xpress SARS-CoV-2/FLU/RSV testing.  Fact Sheet for Patients: BloggerCourse.com  Fact Sheet for Healthcare Providers: SeriousBroker.it  This test is not yet approved or cleared by the [place] FDA and has been authorized for detection and/or diagnosis of SARS-CoV-2 by FDA under an Emergency Use Authorization (EUA). This EUA Susan remain in effect (meaning this test can be used) for the duration of the COVID-19 declaration under Section 564(b)(1) of the Act, 21 U.S.C. section 360bbb-3(b)(1), unless the authorization is terminated or revoked.  Performed at [facility], [address], [place], [place] [address]   MRSA PCR Screening     Status: None   Collection Time: 05/29/20  5:54 PM   Specimen: Nasal Mucosa; Nasopharyngeal  Result Value Ref Range Status   MRSA by PCR NEGATIVE NEGATIVE Final    Comment:        The GeneXpert MRSA Assay (FDA approved for NASAL specimens only), is one component of a comprehensive MRSA colonization surveillance program. It is not intended to diagnose MRSA infection nor to guide or monitor treatment for MRSA infections. Performed at [facility], [address], [place], [place] [address]      Studies: DG Chest Portable 1 View  Result Date: 05/29/2020 CLINICAL DATA:  20 year old female with shortness of breath. EXAM: PORTABLE CHEST - 1 VIEW  COMPARISON:  04/18/2020 FINDINGS: The mediastinal contours are within normal limits. No cardiomegaly. The lungs are clear bilaterally without evidence of focal consolidation, pleural effusion, or pneumothorax. No acute osseous abnormality. IMPRESSION: No acute cardiopulmonary process. Electronically Signed  By: Marliss Coots MD   On: 05/29/2020 14:27   US Abdomen Limited RUQ (LIVER/GB)  Result Date: 05/29/2020 CLINICAL DATA:  Increased LFTs. EXAM: ULTRASOUND ABDOMEN LIMITED RIGHT UPPER QUADRANT COMPARISON:  None. FINDINGS: Gallbladder: No gallstones or wall thickening visualized. No sonographic Murphy sign noted by sonographer. Common bile duct: Diameter: 2.0 mm Liver: No focal lesion identified. Hyperechoic mass within the left lobe of the liver measures 2.8 x 3.0 x 1.1 cm. Portal vein is patent on color Doppler imaging with normal direction of blood flow towards the liver. Other: Question increased echogenicity of the right kidney. IMPRESSION: 1. 3 cm hyperechoic mass in the left lobe of the liver, likely representing a hemangioma or focal fatty infiltration. 2. Question increased echogenicity of the right kidney. Please correlate clinically. Electronically Signed   By: Ted Mcalpine M.D.   On: 05/29/2020 15:59      Joycelyn Das, MD  [facility] 05/31/2020  If 7PM-7AM, please contact night-coverage

## 2020-06-01 DIAGNOSIS — E871 Hypo-osmolality and hyponatremia: Secondary | ICD-10-CM | POA: Diagnosis not present

## 2020-06-01 DIAGNOSIS — E876 Hypokalemia: Secondary | ICD-10-CM | POA: Diagnosis not present

## 2020-06-01 DIAGNOSIS — E1010 Type 1 diabetes mellitus with ketoacidosis without coma: Secondary | ICD-10-CM | POA: Diagnosis not present

## 2020-06-01 LAB — BASIC METABOLIC PANEL
Anion gap: 10 (ref 5–15)
BUN: <5 mg/dL — ABNORMAL LOW (ref 6–20)
CO2: 24 mmol/L (ref 22–32)
Calcium: 8.6 mg/dL — ABNORMAL LOW (ref 8.9–10.3)
Chloride: 101 mmol/L (ref 98–111)
Creatinine, Ser: 0.58 mg/dL (ref 0.44–1.00)
GFR, Estimated: >60 mL/min (ref >60)
Glucose, Bld: 289 mg/dL — ABNORMAL HIGH (ref 70–99)
Potassium: 4.4 mmol/L (ref 3.5–5.1)
Sodium: 135 mmol/L (ref 135–145)

## 2020-06-01 LAB — CBC
HCT: 35.8 % — ABNORMAL LOW (ref 36.0–46.0)
Hemoglobin: 12 g/dL (ref 12.0–15.0)
MCH: 29.9 pg (ref 26.0–34.0)
MCHC: 33.5 g/dL (ref 30.0–36.0)
MCV: 89.1 fL (ref 80.0–100.0)
Platelets: 137 10*3/uL — ABNORMAL LOW (ref 150–400)
RBC: 4.02 MIL/uL (ref 3.87–5.11)
RDW: 14.1 % (ref 11.5–15.5)
WBC: 2.6 10*3/uL — ABNORMAL LOW (ref 4.0–10.5)
nRBC: 0 % (ref 0.0–0.2)

## 2020-06-01 LAB — GLUCOSE, CAPILLARY
Glucose-Capillary: 212 mg/dL — ABNORMAL HIGH (ref 70–99)
Glucose-Capillary: 193 mg/dL — ABNORMAL HIGH (ref 70–99)

## 2020-06-01 LAB — COMPREHENSIVE METABOLIC PANEL
ALT: 11 U/L (ref 0–44)
AST: 15 U/L (ref 15–41)
Albumin: 3 g/dL — ABNORMAL LOW (ref 3.5–5.0)
Alkaline Phosphatase: 109 U/L (ref 38–126)
Anion gap: 10 (ref 5–15)
BUN: <5 mg/dL — ABNORMAL LOW (ref 6–20)
CO2: 26 mmol/L (ref 22–32)
Calcium: 8.4 mg/dL — ABNORMAL LOW (ref 8.9–10.3)
Chloride: 103 mmol/L (ref 98–111)
Creatinine, Ser: 0.56 mg/dL (ref 0.44–1.00)
GFR, Estimated: >60 mL/min (ref >60)
Glucose, Bld: 190 mg/dL — ABNORMAL HIGH (ref 70–99)
Potassium: 2.4 mmol/L — CL (ref 3.5–5.1)
Sodium: 139 mmol/L (ref 135–145)
Total Bilirubin: 0.8 mg/dL (ref 0.3–1.2)
Total Protein: 6 g/dL — ABNORMAL LOW (ref 6.5–8.1)

## 2020-06-01 LAB — PHOSPHORUS: Phosphorus: 3.8 mg/dL (ref 2.5–4.6)

## 2020-06-01 LAB — MAGNESIUM: Magnesium: 2 mg/dL (ref 1.7–2.4)

## 2020-06-01 MED ORDER — POTASSIUM CHLORIDE CRYS ER 20 MEQ PO TBCR: 20.0000 meq | EXTENDED_RELEASE_TABLET | Freq: Every day | ORAL | 0 refills | Status: DC

## 2020-06-01 MED ORDER — INSULIN GLARGINE 100 UNIT/ML SOLOSTAR PEN: 22.0000 [IU] | PEN_INJECTOR | Freq: Every day | SUBCUTANEOUS | Status: DC

## 2020-06-01 MED ORDER — ONDANSETRON HCL 4 MG PO TABS: 4.0000 mg | ORAL_TABLET | Freq: Every day | ORAL | 1 refills | Status: AC | PRN

## 2020-06-01 MED ORDER — POTASSIUM CHLORIDE CRYS ER 20 MEQ PO TBCR
40.0000 meq | EXTENDED_RELEASE_TABLET | Freq: Once | ORAL | Status: AC
  Administered 2020-06-01: 40 meq via ORAL
  Filled 2020-06-01: qty 2

## 2020-06-01 MED ORDER — POTASSIUM CHLORIDE 10 MEQ/100ML IV SOLN
10.0000 meq | INTRAVENOUS | Status: AC
  Administered 2020-06-01 (×6): 10 meq via INTRAVENOUS
  Filled 2020-06-01 (×6): qty 100

## 2020-06-01 MED ORDER — MAGNESIUM OXIDE 400 (241.3 MG) MG PO TABS: 400.0000 mg | ORAL_TABLET | Freq: Every day | ORAL | 0 refills | Status: DC

## 2020-06-01 NOTE — Progress Notes (Signed)
Inpatient Diabetes Program Recommendations  AACE/ADA: New Consensus Statement on Inpatient Glycemic Control (2015)  Target Ranges:  Prepandial:   less than 140 mg/dL      Peak postprandial:   less than 180 mg/dL (1-2 hours)      Critically ill patients:  140 - 180 mg/dL   Lab Results  Component Value Date   GLUCAP 212 (H) 06/01/2020   HGBA1C 10.3 (H) 04/18/2020    Review of Glycemic Control  Diabetes history: DM1  Current orders for Inpatient glycemic control: Lantus 22 units QHS, Novolog 0-9 units TID with meals and 0-5 HS + 3 units TID  Blood sugars above goal.  Inpatient Diabetes Program Recommendations:     Increase Lantus to 24 units QHS  Increase Novolog to 5 units TID with meals if eating > 50%  Will see pt again this am for any concerns.  Thank you. Ailene Ards, RD, LDN, CDE Inpatient Diabetes Coordinator [phone]

## 2020-06-01 NOTE — TOC Progression Note (Signed)
Transition of Care [facility] - Progression Note    Patient Details  Name: Janise Gora MRN: [id number] Date of Birth: [birth year]  Transition of Care [facility]) CM/SW Contact  Golda Acre, RN Phone Number: 06/01/2020, 7:49 AM  Clinical Narrative:    Principal Problem:   DKA, type 1 (HCC) Active Problems:   Hypokalemia   Hypophosphatemia   DKA (diabetic ketoacidosis) (HCC)   Hyponatremia   Type 1 diabetes mellitus with hyperglycemia (HCC)  Diabetic ketoacidosis patient with type 1 diabetes.  Diabetic ketoacidosis resolved.  Off insulin drip.  Has been transitioned to Lantus sliding scale insulin in mealtime insulin.  Patient takes Lantus 22 units at bedtime.  Will be resumed from today.  Continue IV hydration.  Has been transitioned to oral diet   Severe hypokalemia  Continue to replenish aggressively.  Received 2 g of magnesium sulfate this morning.  Magnesium of 1.7.  Despite multiple replacements still very low.  We will continue to replenish aggressively.  Check BMP at 12 PM.  Severe hypophosphatemia.  Received multiple doses  IV K-Phos.  Continue oral K-Phos.  Currently receiving 45 millibolus of potassium phosphate through the IV.  We will continue to monitor closely.    Mild hyponatremia.  Likely pseudohyponatremia.  Improving with improvement in blood glucose levels.  DVT prophylaxis: Place and maintain sequential compression device Start: 05/30/20 1033 enoxaparin (LOVENOX) injection 40 mg Start: 05/29/20 2200  PLan: following for p[rogression and toc needs, home with self care is planned at this time.   Expected Discharge Plan: Home/Self Care Barriers to Discharge: Continued Medical Work up  Expected Discharge Plan and Services Expected Discharge Plan: Home/Self Care   Discharge Planning Services: CM Consult                                           Social Determinants of Health (SDOH) Interventions    Readmission Risk Interventions No  flowsheet data found.

## 2020-06-01 NOTE — Discharge Summary (Signed)
Physician Discharge Summary  Susan [name] JSR:[id number] DOB: November 09, 2000 DOA: 05/29/2020  PCP: System, Provider Not In  Admit date: 05/29/2020 Discharge date: 06/01/2020  Admitted From: Home  Discharge disposition: Home  Recommendations for Outpatient Follow-Up:   . Follow up with your endocrinologist within 1 week/as scheduled by you . Check CBC, BMP, phosphorus, magnesium in the next visit . Patient to discuss about insulin pump with her endocrinologist.  Discharge Diagnosis:   Principal Problem:   DKA, type 1 ([place]) Active Problems:   Hypokalemia   Hypophosphatemia   DKA (diabetic ketoacidosis) ([place])   Hyponatremia   Type 1 diabetes mellitus with hyperglycemia ([place])   Discharge Condition: Improved.  Diet recommendation: Carbohydrate-modified.    Wound care: None.  Code status: Full.   History of Present Illness:   Susan [name] a 20 y.o.femalewith history of type 1 diabetes on insulin,recently diagnosed in March 2022,presented to the hospital with complaints of vomiting for the last 3 days. She was initially seen at urgent care center and was sent to the hospital due to ketones in urine. Patient has been having elevated blood glucose levels for the last 2 to 3 days more than 300s. In the urgent care blood glucose was almost 400.   Patient stated that this was preceded by eating some bad strength with made her started throwing up. In the ED, patient had Kussmaul breathing. She was mildly tachycardic. She had low bicarbonate on the BMP and anion gap was elevated. Patient was diagnosed to be having diabetic ketoacidosis and was considered for admission to the hospital. Initial sodium was 130, bicarb of 10 with blood glucose level of 377 and creatinine at 1.0. Anion gap was 20. Lipase was 56. Urinalysis was positive for ketones and glucose more than 500. Beta hydroxybutyrate acid was more than 8. Initial venous pH was 7.1 in the ED, patient received  Ringer lactate bolus 2 L, insulin drip was initiated. Patient also received potassium chloride as per protocol.  Patient was then admitted to the stepdown unit for DKA.   Hospital Course:   Following conditions were addressed during hospitalization as listed below,  Diabetic ketoacidosis patient with type 1 diabetes.  Diabetic ketoacidosis resolved after insulin drip.  Patient has been transitioned back to her home insulin regimen including long-acting Lantus.  Severe hypokalemia  Patient received several rounds of IV potassium including oral potassium during hospitalization.  Will be given potassium supplement for the next 5 days on discharge.  Potassium prior to discharge was 4.4.  Severe hypophosphatemia.  Received multiple doses  IV K-Phos, oral K-Phos.  This has improved to 3.8 at this time.  Mild hyponatremia.  Likely pseudohyponatremia.    Improved with IV fluids and insulin.  Disposition.  At this time, patient is stable for disposition home with outpatient endocrinology follow-up.  Spoke with the patient's father at bedside  Medical Consultants:    None.  Procedures:    None Subjective:   Today, patient seen and examined at bedside.  Denies any nausea vomiting shortness of breath cough fever.  Tolerating p.o. diet.  Discharge Exam:   Vitals:   06/01/20 0800 06/01/20 1205  BP:  (!) 115/92  Pulse: 92 89  Resp:  16  Temp: 98.3 F (36.8 C) 98.6 F (37 C)  SpO2: 100% 99%   Vitals:   06/01/20 0400 06/01/20 0600 06/01/20 0800 06/01/20 1205  BP: (!) 109/47 (!) 105/47  (!) 115/92  Pulse: 79  92 89  Resp: 15 14  16  Temp:   98.3 F (36.8 C) 98.6 F (37 C)  TempSrc:   Oral Oral  SpO2: 99%  100% 99%  Weight:      Height:       General: Alert awake, not in obvious distress obese HENT: pupils equally reacting to light,  No scleral pallor or icterus noted. Oral mucosa is moist.  Chest:  Clear breath sounds.  Diminished breath sounds bilaterally. No crackles or  wheezes.  CVS: S1 &S2 heard. No murmur.  Regular rate and rhythm. Abdomen: Soft, nontender, nondistended.  Bowel sounds are heard.   Extremities: No cyanosis, clubbing or edema.  Peripheral pulses are palpable. Psych: Alert, awake and oriented, normal mood CNS:  No cranial nerve deficits.  Power equal in all extremities.   Skin: Warm and dry.  No rashes noted.  The results of significant diagnostics from this hospitalization (including imaging, microbiology, ancillary and laboratory) are listed below for reference.     Diagnostic Studies:   DG Chest Portable 1 View  Result Date: 05/29/2020 CLINICAL DATA:  20 year old female with shortness of breath. EXAM: PORTABLE CHEST - 1 VIEW COMPARISON:  04/18/2020 FINDINGS: The mediastinal contours are within normal limits. No cardiomegaly. The lungs are clear bilaterally without evidence of focal consolidation, pleural effusion, or pneumothorax. No acute osseous abnormality. IMPRESSION: No acute cardiopulmonary process. Electronically Signed   By: Ruthann Cancer MD   On: 05/29/2020 14:27   US Abdomen Limited RUQ (LIVER/GB)  Result Date: 05/29/2020 CLINICAL DATA:  Increased LFTs. EXAM: ULTRASOUND ABDOMEN LIMITED RIGHT UPPER QUADRANT COMPARISON:  None. FINDINGS: Gallbladder: No gallstones or wall thickening visualized. No sonographic Murphy sign noted by sonographer. Common bile duct: Diameter: 2.0 mm Liver: No focal lesion identified. Hyperechoic mass within the left lobe of the liver measures 2.8 x 3.0 x 1.1 cm. Portal vein is patent on color Doppler imaging with normal direction of blood flow towards the liver. Other: Question increased echogenicity of the right kidney. IMPRESSION: 1. 3 cm hyperechoic mass in the left lobe of the liver, likely representing a hemangioma or focal fatty infiltration. 2. Question increased echogenicity of the right kidney. Please correlate clinically. Electronically Signed   By: Fidela Salisbury M.D.   On: 05/29/2020 15:59      Labs:   Basic Metabolic Panel: Recent Labs  Lab 05/30/20 0254 05/30/20 0757 05/30/20 1142 05/30/20 1627 05/31/20 0212 05/31/20 1203 05/31/20 1733 06/01/20 0249 06/01/20 1307  NA 132*   < > 134*   < > 132* 137 137 139 135  K 2.8*   < > 3.3*   < > 2.6* 3.5 3.2* 2.4* 4.4  CL 109   < > 109   < > 102 104 103 103 101  CO2 12*   < > 17*   < > 17* '22 22 26 24  ' GLUCOSE 121*   < > 211*   < > 246* 332* 317* 190* 289*  BUN 9   < > 8   < > 5* <5* <5* <5* <5*  CREATININE 0.73   < > 0.72   < > 0.61 0.76 0.62 0.56 0.58  CALCIUM 9.6   < > 9.2   < > 8.7* 8.6* 8.7* 8.4* 8.6*  MG 1.8  --  1.8  --  1.7  --  1.9 2.0  --   PHOS <1.0*  --  1.8*  --  1.0* 3.2 2.3* 3.8  --    < > = values in this interval not displayed.  GFR Estimated Creatinine Clearance: 122.7 mL/min (by C-G formula based on SCr of 0.58 mg/dL). Liver Function Tests: Recent Labs  Lab 05/29/20 1310 05/30/20 0254 05/31/20 0212 06/01/20 0249  AST 15 12* 13* 15  ALT '9 9 8 11  ' ALKPHOS 172* 124 109 109  BILITOT 1.2 1.3* 1.0 0.8  PROT 9.9* 7.4 6.4* 6.0*  ALBUMIN 4.9 3.8 3.2* 3.0*   Recent Labs  Lab 05/29/20 1310  LIPASE 56*   No results for input(s): AMMONIA in the last 168 hours. Coagulation profile No results for input(s): INR, PROTIME in the last 168 hours.  CBC: Recent Labs  Lab 05/29/20 1310 05/29/20 1329 05/30/20 0254 05/31/20 0212 06/01/20 0249  WBC 6.3  --  4.6 3.3* 2.6*  NEUTROABS 4.0  --   --   --   --   HGB 16.3* 18.0* 13.9 12.4 12.0  HCT 50.6* 53.0* 41.8 36.0 35.8*  MCV 93.4  --  91.1 90.0 89.1  PLT 240  --  185 156 137*   Cardiac Enzymes: No results for input(s): CKTOTAL, CKMB, CKMBINDEX, TROPONINI in the last 168 hours. BNP: Invalid input(s): POCBNP CBG: Recent Labs  Lab 05/31/20 1135 05/31/20 1611 05/31/20 2118 06/01/20 0807 06/01/20 1202  GLUCAP 357* 217* 229* 212* 193*   D-Dimer No results for input(s): DDIMER in the last 72 hours. Hgb A1c No results for input(s): HGBA1C in  the last 72 hours. Lipid Profile No results for input(s): CHOL, HDL, LDLCALC, TRIG, CHOLHDL, LDLDIRECT in the last 72 hours. Thyroid function studies No results for input(s): TSH, T4TOTAL, T3FREE, THYROIDAB in the last 72 hours.  Invalid input(s): FREET3 Anemia work up No results for input(s): VITAMINB12, FOLATE, FERRITIN, TIBC, IRON, RETICCTPCT in the last 72 hours. Microbiology Recent Results (from the past 240 hour(s))  Resp Panel by RT-PCR (Flu A&B, Covid) Nasopharyngeal Swab     Status: None   Collection Time: 05/29/20  1:15 PM   Specimen: Nasopharyngeal Swab; Nasopharyngeal(NP) swabs in vial transport medium  Result Value Ref Range Status   SARS Coronavirus 2 by RT PCR NEGATIVE NEGATIVE Final    Comment: (NOTE) SARS-CoV-2 target nucleic acids are NOT DETECTED.  The SARS-CoV-2 RNA is generally detectable in upper respiratory specimens during the acute phase of infection. The lowest concentration of SARS-CoV-2 viral copies this assay can detect is 138 copies/mL. A negative result does not preclude SARS-Cov-2 infection and should not be used as the sole basis for treatment or other patient management decisions. A negative result may occur with  improper specimen collection/handling, submission of specimen other than nasopharyngeal swab, presence of viral mutation(s) within the areas targeted by this assay, and inadequate number of viral copies(<138 copies/mL). A negative result must be combined with clinical observations, patient history, and epidemiological information. The expected result is Negative.  Fact Sheet for Patients:  EntrepreneurPulse.com.au  Fact Sheet for Healthcare Providers:  IncredibleEmployment.be  This test is no t yet approved or cleared by the [place] FDA and  has been authorized for detection and/or diagnosis of SARS-CoV-2 by FDA under an Emergency Use Authorization (EUA). This EUA will remain  in effect  (meaning this test can be used) for the duration of the COVID-19 declaration under Section 564(b)(1) of the Act, 21 U.S.C.section 360bbb-3(b)(1), unless the authorization is terminated  or revoked sooner.       Influenza A by PCR NEGATIVE NEGATIVE Final   Influenza B by PCR NEGATIVE NEGATIVE Final    Comment: (NOTE) The Xpert Xpress SARS-CoV-2/FLU/RSV plus assay  is intended as an aid in the diagnosis of influenza from Nasopharyngeal swab specimens and should not be used as a sole basis for treatment. Nasal washings and aspirates are unacceptable for Xpert Xpress SARS-CoV-2/FLU/RSV testing.  Fact Sheet for Patients: EntrepreneurPulse.com.au  Fact Sheet for Healthcare Providers: IncredibleEmployment.be  This test is not yet approved or cleared by the [place] FDA and has been authorized for detection and/or diagnosis of SARS-CoV-2 by FDA under an Emergency Use Authorization (EUA). This EUA will remain in effect (meaning this test can be used) for the duration of the COVID-19 declaration under Section 564(b)(1) of the Act, 21 U.S.C. section 360bbb-3(b)(1), unless the authorization is terminated or revoked.  Performed at [facility], [place] [address], [place], [place] [address]   MRSA PCR Screening     Status: None   Collection Time: 05/29/20  5:54 PM   Specimen: Nasal Mucosa; Nasopharyngeal  Result Value Ref Range Status   MRSA by PCR NEGATIVE NEGATIVE Final    Comment:        The GeneXpert MRSA Assay (FDA approved for NASAL specimens only), is one component of a comprehensive MRSA colonization surveillance program. It is not intended to diagnose MRSA infection nor to guide or monitor treatment for MRSA infections. Performed at [facility], [place] [address], [place], [place] [address]      Discharge Instructions:   Discharge Instructions    Diet - low sodium heart healthy, diabetic diet  Complete by: As directed    Discharge instructions   Complete by: As directed    Follow-up with your endocrinologist as has been scheduled by you.  Check blood work at that time.  Continue to take potassium magnesium supplements.  Discuss about insulin pump with your endocrinologist.  Seek medical attention for worsening symptoms.  Please do not interrupt taking insulin.   Increase activity slowly   Complete by: As directed      Allergies as of 06/01/2020   No Known Allergies     Medication List    TAKE these medications   blood glucose meter kit and supplies Dispense based on patient and insurance preference. Use up to four times daily as directed. (FOR ICD-10 E10.9, E11.9).   cetirizine 10 MG tablet Commonly known as: ZYRTEC Take 10 mg by mouth daily.   insulin aspart 100 UNIT/ML FlexPen Commonly known as: NOVOLOG Inject 5 Units into the skin 3 (three) times daily with meals. What changed: Another medication with the same name was changed. Make sure you understand how and when to take each.   insulin aspart 100 UNIT/ML FlexPen Commonly known as: NOVOLOG For glucose 120 to 150 use 1 unit, for 151 to 200 use 2 units, for 201 to 250 use 3 units, for 251 to 300 use 5 units, for 301 to 350 use 7 units, for 351 to 400 use 9 units. What changed:   how much to take  how to take this  when to take this   insulin glargine 100 UNIT/ML Solostar Pen Commonly known as: LANTUS Inject 22 Units into the skin daily.   ketoconazole 2 % shampoo Commonly known as: NIZORAL Apply 1 application topically every 14 (fourteen) days.   magnesium oxide 400 (241.3 Mg) MG tablet Commonly known as: MAG-OX Take 1 tablet (400 mg total) by mouth daily.   ondansetron 4 MG tablet Commonly known as: Zofran Take 1 tablet (4 mg total) by mouth daily as needed for nausea or vomiting.   Pen  Needles 3/16" 31G X 5 MM Misc 1 Dose by Does not apply route 4 (four) times daily - after meals and at  bedtime.   potassium chloride SA 20 MEQ tablet Commonly known as: KLOR-CON Take 1 tablet (20 mEq total) by mouth daily for 5 days.       Follow-up Information    Your endocrinologist Follow up in 1 week(s).   Why: check blood work, discuss insulin pump               Time coordinating discharge: 39 minutes  Signed:  Lindey Renzulli  [facility] 06/01/2020, 4:06 PM

## 2020-06-01 NOTE — Progress Notes (Signed)
Date and time results received: 06/01/20, 3:44 AM  Critical Value: Potassium=2.4  Name of Provider Notified: Granville Lewis MD  MD ordered for Potassium chloride 6 x IV, PO

## 2020-06-09 ENCOUNTER — Ambulatory Visit (INDEPENDENT_AMBULATORY_CARE_PROVIDER_SITE_OTHER): Payer: Medicaid - Out of State | Admitting: Internal Medicine

## 2020-06-09 ENCOUNTER — Other Ambulatory Visit: Payer: Self-pay

## 2020-06-09 ENCOUNTER — Encounter: Payer: Self-pay | Admitting: Internal Medicine

## 2020-06-09 VITALS — BP 126/82 | HR 102 | Ht 66.0 in | Wt 192.1 lb

## 2020-06-09 DIAGNOSIS — E1065 Type 1 diabetes mellitus with hyperglycemia: Secondary | ICD-10-CM | POA: Diagnosis not present

## 2020-06-09 MED ORDER — OMNIPOD DASH PODS (GEN 4) MISC: 1.0000 | 11 refills | Status: DC

## 2020-06-09 MED ORDER — INSULIN GLARGINE 100 UNIT/ML SOLOSTAR PEN: 26.0000 [IU] | PEN_INJECTOR | Freq: Every day | SUBCUTANEOUS | 6 refills | Status: DC

## 2020-06-09 MED ORDER — INSULIN ASPART 100 UNIT/ML FLEXPEN: PEN_INJECTOR | SUBCUTANEOUS | 6 refills | Status: DC

## 2020-06-09 MED ORDER — DEXCOM G6 SENSOR MISC: 1.0000 | 3 refills | Status: DC

## 2020-06-09 MED ORDER — INSULIN PEN NEEDLE 30G X 5 MM MISC: 1.0000 | Freq: Four times a day (QID) | 6 refills | Status: DC

## 2020-06-09 MED ORDER — DEXCOM G6 TRANSMITTER MISC
1.0000 | 3 refills | Status: DC
Start: 2020-06-09 — End: 2021-05-03

## 2020-06-09 NOTE — Progress Notes (Signed)
Name: Susan [name]  MRN/ DOB: [id number], Nov 19, 2000   Age/ Sex: 20 y.o., female    PCP: Pcp, No   Reason for Endocrinology Evaluation: Type 1 Diabetes Mellitus     Date of Initial Endocrinology Visit: 06/09/2020     PATIENT IDENTIFIER: Susan [name] is a 20 y.o. female with a past medical history of T1DM . The patient presented for initial endocrinology clinic visit on 06/09/2020 for consultative assistance with her diabetes management.    HPI: [name] is accompanied by her father    Diagnosed with DM 2020 Prior Medications tried/Intolerance: Was on insulin initially while in [place], eventually was taken off insulin with low Ac but restarted on insulin during hospitalization 05/2020 Currently checking blood sugars 1x a day Hypoglycemia episodes : no         Hemoglobin A1c has ranged from 10.1% in 2020, peaking at 10.3% in 2022. Patient required assistance for hypoglycemia: no  Patient has required hospitalization within the last 1 year from hyper or hypoglycemia: 05/2020- DKA  In terms of diet, the patient eats 3 meals a day, stopped snacking 2 weeks. Drinks sweet tea .    Studies Engineer, drilling    HOME DIABETES REGIMEN: Novolog SS Lantus 22 units daily      Statin: no ACE-I/ARB: no Prior Diabetic Education: no   METER DOWNLOAD SUMMARY: unable to download  > 342  DIABETIC COMPLICATIONS: Microvascular complications:   Denies: CKD, neuropathy, retinopathy  Last eye exam: Completed 09/2019  Macrovascular complications:   Denies: CAD, PVD, CVA   PAST HISTORY: Past Medical History:  Past Medical History:  Diagnosis Date  . Diabetes [facility])    Past Surgical History: History reviewed. No pertinent surgical history.  Social History:  reports that she has never smoked. She has never used smokeless tobacco. She reports that she does not drink alcohol and does not use drugs. Family History:  Family History  Problem Relation Age of Onset  . Diabetes  Mother   . Diabetes Father      HOME MEDICATIONS: Allergies as of 06/09/2020   No Known Allergies     Medication List       Accurate as of June 09, 2020  8:13 AM. If you have any questions, ask your nurse or doctor.        STOP taking these medications   potassium chloride SA 20 MEQ tablet Commonly known as: KLOR-CON Stopped by: Dorita Sciara, MD     TAKE these medications   blood glucose meter kit and supplies Dispense based on patient and insurance preference. Use up to four times daily as directed. (FOR ICD-10 E10.9, E11.9).   cetirizine 10 MG tablet Commonly known as: ZYRTEC Take 10 mg by mouth daily.   insulin aspart 100 UNIT/ML FlexPen Commonly known as: NOVOLOG For glucose 120 to 150 use 1 unit, for 151 to 200 use 2 units, for 201 to 250 use 3 units, for 251 to 300 use 5 units, for 301 to 350 use 7 units, for 351 to 400 use 9 units. What changed:  how much to take how to take this when to take this   insulin glargine 100 UNIT/ML Solostar Pen Commonly known as: LANTUS Inject 22 Units into the skin daily.   ketoconazole 2 % shampoo Commonly known as: NIZORAL Apply 1 application topically every 14 (fourteen) days.   magnesium oxide 400 (241.3 Mg) MG tablet Commonly known as: MAG-OX Take 1 tablet (400 mg total) by mouth daily.  ondansetron 4 MG tablet Commonly known as: Zofran Take 1 tablet (4 mg total) by mouth daily as needed for nausea or vomiting.   Pen Needles 3/16" 31G X 5 MM Misc 1 Dose by Does not apply route 4 (four) times daily - after meals and at bedtime.        ALLERGIES: No Known Allergies   REVIEW OF SYSTEMS: A comprehensive ROS was conducted with the patient and is negative except as per HPI and below:  Review of Systems  Gastrointestinal: Negative for nausea and vomiting.  Genitourinary: Negative for frequency.  Neurological: Negative for tingling.  Endo/Heme/Allergies: Negative for polydipsia.      OBJECTIVE:    VITAL SIGNS: BP 126/82   Pulse (!) 102   Ht '5\' 6"'  (1.676 m)   Wt 192 lb 2 oz (87.1 kg)   LMP 05/29/2020   SpO2 98%   BMI 31.01 kg/m    PHYSICAL EXAM:  General: Pt appears well and is in NAD  Neck: General: Supple without adenopathy or carotid bruits. Thyroid: Thyroid size normal.  No goiter or nodules appreciated. No thyroid bruit.  Lungs: Clear with good BS bilat with no rales, rhonchi, or wheezes  Heart: RRR with normal S1 and S2 and no gallops; no murmurs; no rub  Abdomen: Normoactive bowel sounds, soft, nontender, without masses or organomegaly palpable  Extremities:  Lower extremities - No pretibial edema. No lesions.  Skin: Normal texture and temperature to palpation. No rash noted. No Acanthosis nigricans/skin tags. No lipohypertrophy.  Neuro: MS is good with appropriate affect, pt is alert and Ox3    DM foot exam: 06/09/2020   The skin of the feet is intact without sores or ulcerations. The pedal pulses are 2+ on right and 2+ on left. The sensation is intact to a screening 5.07, 10 gram monofilament bilaterally    DATA REVIEWED:  Lab Results  Component Value Date   HGBA1C 10.3 (H) 04/18/2020   HGBA1C 10.1 (H) 12/03/2018   Lab Results  Component Value Date   CREATININE 0.58 06/01/2020     ASSESSMENT / PLAN / RECOMMENDATIONS:   1) Type 1 Diabetes Mellitus, Poorly controlled, Without complications - Most recent A1c of 10.3 %. Goal A1c < 7.0 %.    Plan: GENERAL: I have discussed with the patient the pathophysiology of diabetes. We went over the natural progression of the disease. We talked about both insulin resistance and insulin deficiency. We stressed the importance of lifestyle changes including diet and exercise. I explained the complications associated with diabetes including retinopathy, nephropathy, neuropathy as well as increased risk of cardiovascular disease. We went over the benefit seen with glycemic control.   I explained to the patient that  diabetic patients are at higher than normal risk for amputations.  Discussed pharmacokinetics of basal/bolus insulin and the importance of taking prandial insulin with meals.  We also discussed avoiding sugar-sweetened beverages and snacks, when possible.  She is interested in the [facility], she will be referred to our CDE A prescription for Dexcom was sent  In the meantime, will make the following changes   MEDICATIONS: - Increase Lantus to 26 units daily  - Novolog 8 units with each meal  - Correction scale : Novolog (BG-130/30)  EDUCATION / INSTRUCTIONS: BG monitoring instructions: Patient is instructed to check her blood sugars 3 times a day, before meals . Call [facility] if: BG persistently < 70  I reviewed the Rule of 15 for the treatment of hypoglycemia in  detail with the patient. Literature supplied.   2) Diabetic complications:  Eye: Does not have known diabetic retinopathy.  Neuro/ Feet: Does not have known diabetic peripheral neuropathy. Renal: Patient does not have known baseline CKD. She is not on an ACEI/ARB at present.   3) Lipids: No indication for statin therapy .    I spent 45 minutes preparing to see the patient by review of recent labs, imaging and procedures, obtaining and reviewing separately obtained history, communicating with the patient/family or caregiver, ordering medications, tests or procedures, and documenting clinical information in the EHR including the differential Dx, treatment, and any further evaluation and other management     Signed electronically by: Mack Guise, MD  [facility]  [facility] [address] [place], [place] [address] Phone: [phone] FAX: [phone]   CC: Pcp, No No address on file Phone: None  Fax: None    Return to Endocrinology clinic as below: No future appointments.

## 2020-06-09 NOTE — Patient Instructions (Addendum)
-   Increase Lantus to 26 units daily  - Novolog 8 units with each meal  -Novolog correctional insulin: ADD extra units on insulin to your meal-time Novolog dose if your blood sugars are higher than . Use the scale below to help guide you:   Blood sugar before meal Number of units to inject  Less than 160 0 unit  161 -  190 1 units  191 -  220 2 units  221 -  250 3 units  251 -  280 4 units  281 -  310 5 units  311 -  340 6 units  341 -  370 7 units  371 -  400 8 units  401- 430 9 units  431 - 460 10 units   461 - 490  11 units      A prescription for the Omnipod pods and dexcom sent to the pharmacy     HOW TO TREAT LOW BLOOD SUGARS (Blood sugar LESS THAN 70 MG/DL)  Please follow the RULE OF 15 for the treatment of hypoglycemia treatment (when your (blood sugars are less than 70 mg/dL)    STEP 1: Take 15 grams of carbohydrates when your blood sugar is low, which includes:   3-4 GLUCOSE TABS  OR  3-4 OZ OF JUICE OR REGULAR SODA OR  ONE TUBE OF GLUCOSE GEL     STEP 2: RECHECK blood sugar in 15 MINUTES STEP 3: If your blood sugar is still low at the 15 minute recheck --> then, go back to STEP 1 and treat AGAIN with another 15 grams of carbohydrates.

## 2020-06-10 ENCOUNTER — Encounter: Payer: No Typology Code available for payment source | Attending: Internal Medicine | Admitting: Dietician

## 2020-06-10 ENCOUNTER — Encounter: Payer: Self-pay | Admitting: Dietician

## 2020-06-10 DIAGNOSIS — E1065 Type 1 diabetes mellitus with hyperglycemia: Secondary | ICD-10-CM | POA: Diagnosis not present

## 2020-06-10 NOTE — Patient Instructions (Addendum)
Continue to stay active. Continue to take your medications as prescribed Continue to check your blood sugar.  Practice carbohydrate counting and label reading. Be sure to include at least of small amount of carbohydrate with each meal.  Be sure to have something with you at all times to treat a low blood sugar.  Please call for training when you get your Dexcom. Please call for training when you get your Omnipod.

## 2020-06-10 NOTE — Progress Notes (Signed)
Diabetes Self-Management Education  Visit Type: First/Initial  Appt. Start Time: 1310 Appt. End Time: 1440  06/10/2020  [name], identified by name and date of birth, is a 20 y.o. female with a diagnosis of Diabetes: Type 1.   ASSESSMENT Patient is here today alone.  She needs to learn about Carbohydrate Counting.  She will be starting the [employer] and Dexcom when they arrive. She states that she was diagnosed with type 1 diabetes in 2020 but lab tests did not confirm this when she moved back to [place], however labs now indicate she has type 1 diabetes.  She was recently diagnosed with DKA. Diabetes control has been poor.  She denies forgetting to take insulin.  History includes Type 1 Diabetes, DKA A1C 10.3% 04/18/2020 Medications include Lantus 26 units q HS, Novolog 8 units tid prior to meals.  Weight hx: 192 lbs 06/09/20 210 lbs 05/18/2020 Lost weight with intermittent fasting (eating window 8/9 am - 6/7 pm)  Patient lives in a student apartment.  She does some shopping and cooking and eats at the cafeteria at times.  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  She is considering medical school or doing research and going to graduate school.  Weight 192 lb (87.1 kg), last menstrual period 05/29/2020. Body mass index is 30.99 kg/m.   Diabetes Self-Management Education - 06/10/20 1329      Visit Information   Visit Type First/Initial      Initial Visit   Diabetes Type Type 1    Are you currently following a meal plan? No    Are you taking your medications as prescribed? Yes    Date Diagnosed 2020      Health Coping   How would you rate your overall health? Good      Psychosocial Assessment   Patient Belief/Attitude about Diabetes Motivated to manage diabetes    Self-care barriers None    Self-management support Doctor's office    Other persons present Patient    Patient Concerns Nutrition/Meal planning;Glycemic Control;Other (comment)    carbohydrate counting   Special Needs None    Preferred Learning Style No preference indicated    Learning Readiness Ready    How often do you need to have someone help you when you read instructions, pamphlets, or other written materials from your doctor or pharmacy? 1 - Never    What is the last grade level you completed in school? 3 years college      Pre-Education Assessment   Patient understands the diabetes disease and treatment process. Needs Instruction    Patient understands incorporating nutritional management into lifestyle. Needs Instruction    Patient undertands incorporating physical activity into lifestyle. Needs Instruction    Patient understands using medications safely. Needs Instruction    Patient understands monitoring blood glucose, interpreting and using results Needs Instruction    Patient understands prevention, detection, and treatment of acute complications. Needs Instruction    Patient understands prevention, detection, and treatment of chronic complications. Needs Instruction    Patient understands how to develop strategies to address psychosocial issues. Needs Instruction    Patient understands how to develop strategies to promote health/change behavior. Needs Instruction      Complications   Last HgB A1C per patient/outside source 10.3 %   04/18/20 increased form 10.1% 11/2018   How often do you check your blood sugar? 3-4 times/day    Fasting Blood glucose range (mg/dL) >573   220'U   Number of  hypoglycemic episodes per month 1    Can you tell when your blood sugar is low? Yes    What do you do if your blood sugar is low? drank juice    Number of hyperglycemic episodes per week 28    Can you tell when your blood sugar is high? Yes    What do you do if your blood sugar is high? drinks a lot of water    Have you had a dilated eye exam in the past 12 months? Yes    Have you had a dental exam in the past 12 months? Yes    Are you checking your feet? Yes     How many days per week are you checking your feet? 7      Dietary Intake   Breakfast protein shake or orange (depending on time)    Snack (morning) none    Lunch smoothie - kale spinach, berries, water (if not at school) OR school cafeteria (meat, vegetable, starch +)    Snack (afternoon) granola bar    Dinner chicken, broccoli, occasional rice    Snack (evening) zero sugar ice pop      Exercise   Exercise Type Light (walking / raking leaves)    How many days per week to you exercise? 7    How many minutes per day do you exercise? 90    Total minutes per week of exercise 630      Patient Education   Disease state  Definition of diabetes, type 1 and 2, and the diagnosis of diabetes    Nutrition management  Role of diet in the treatment of diabetes and the relationship between the three main macronutrients and blood glucose level;Food label reading, portion sizes and measuring food.;Carbohydrate counting;Meal options for control of blood glucose level and chronic complications.;Information on hints to eating out and maintain blood glucose control.;Reviewed blood glucose goals for pre and post meals and how to evaluate the patients' food intake on their blood glucose level.;Meal timing in regards to the patients' current diabetes medication.    Physical activity and exercise  Role of exercise on diabetes management, blood pressure control and cardiac health.    Medications Taught/reviewed insulin injection, site rotation, insulin storage and needle disposal.;Reviewed patients medication for diabetes, action, purpose, timing of dose and side effects.    Monitoring Yearly dilated eye exam;Daily foot exams;Identified appropriate SMBG and/or A1C goals.;Taught/discussed recording of test results and interpretation of SMBG.    Acute complications Taught treatment of hypoglycemia - the 15 rule.;Discussed and identified patients' treatment of hyperglycemia.    Chronic complications Relationship between  chronic complications and blood glucose control    Psychosocial adjustment Role of stress on diabetes    Preconception care Pregnancy and GDM  Role of pre-pregnancy blood glucose control on the development of the fetus;Role of family planning for patients with diabetes      Individualized Goals (developed by patient)   Nutrition General guidelines for healthy choices and portions discussed;Other (comment)   carb count   Physical Activity Exercise 5-7 days per week;60 minutes per day    Medications take my medication as prescribed    Monitoring  test my blood glucose as discussed    Reducing Risk examine blood glucose patterns;increase portions of healthy fats      Post-Education Assessment   Patient understands the diabetes disease and treatment process. Demonstrates understanding / competency    Patient understands incorporating nutritional management into lifestyle. Needs Review  Patient undertands incorporating physical activity into lifestyle. Demonstrates understanding / competency    Patient understands using medications safely. Demonstrates understanding / competency    Patient understands monitoring blood glucose, interpreting and using results Demonstrates understanding / competency    Patient understands prevention, detection, and treatment of acute complications. Demonstrates understanding / competency    Patient understands prevention, detection, and treatment of chronic complications. Demonstrates understanding / competency    Patient understands how to develop strategies to address psychosocial issues. Demonstrates understanding / competency    Patient understands how to develop strategies to promote health/change behavior. Needs Review      Outcomes   Expected Outcomes Demonstrated interest in learning. Expect positive outcomes    Future DMSE PRN    Program Status Completed           Individualized Plan for Diabetes Self-Management Training:   Learning Objective:   Patient will have a greater understanding of diabetes self-management. Patient education plan is to attend individual and/or group sessions per assessed needs and concerns.   Plan:   Patient Instructions  Continue to stay active. Continue to take your medications as prescribed Continue to check your blood sugar.  Practice carbohydrate counting and label reading. Be sure to include at least of small amount of carbohydrate with each meal.  Be sure to have something with you at all times to treat a low blood sugar.  Please call for training when you get your Dexcom. Please call for training when you get your Omnipod.   Expected Outcomes:  Demonstrated interest in learning. Expect positive outcomes  Education material provided: ADA - How to Thrive: A Guide for Your Journey with Diabetes and Meal plan card, label reading  If problems or questions, patient to contact team via:  Phone  Future DSME appointment: PRN

## 2020-06-20 ENCOUNTER — Other Ambulatory Visit: Payer: Self-pay

## 2020-06-20 ENCOUNTER — Encounter: Payer: No Typology Code available for payment source | Attending: Internal Medicine | Admitting: Nutrition

## 2020-06-20 DIAGNOSIS — E1065 Type 1 diabetes mellitus with hyperglycemia: Secondary | ICD-10-CM | POA: Insufficient documentation

## 2020-06-21 ENCOUNTER — Telehealth: Payer: Self-pay | Admitting: Nutrition

## 2020-06-21 NOTE — Progress Notes (Signed)
Patient was trained on how to use the Dash pump system.  She is currently wearing a Dexcom system.  This was linked to [facility].   We discussed how pumps deliver insulin, and the difference between basal and bolus insulin delivery. She  reports that she is very familiar with carb counting and does this regularly.  She refused additional printed information of this. Settings were put in per Dr. Harvel Ricks orders:  Basal rate: 0.80, ISF: 30, I/C: 15, target: 100 with correction over 100, timing: 4 hours.   She filled a cartridge with Novolog insulin, and applied it to her right upper outer arm.  She was shown how to bolus, and she re demonstrated this correctly X2.  We also discussed temp basal rates: when and how to use them, sick day guidelines, high blood sugar protocols, alerts and alarms, and low blood sugar treatments.    She was given a pump package handout with information on all of the above topics, as well as sick day guidelines, emergency supplies to carry with her.  She was strongly encouraged to read all of this, as well as the resource guide and pump manual We reviewed each topic on the checklist and she reported good understanding, and had no final questions. Information/handout given on how to download the podder's app, and how to link to our practice.  She agreed to set this up when she gets home today.

## 2020-06-21 NOTE — Patient Instructions (Signed)
Read over handouts given, resource packet and pump manual. Download Calorie [employer]oke Dare app for your phone. Call if blood sugars remain over 200, or drop below 70.

## 2020-06-21 NOTE — Telephone Encounter (Signed)
Patient reported no difficulty bolusing for lunch and supper.  Blood sugar was 108 acS tonight, and is now 150, 2hr. PcS.  She reports no low blood sugars.  She was told that if her blood sugar is below 100 at HS, to eat 10 grams of carbs without bolusing.  She agreed to do this and had no final questions.

## 2020-06-21 NOTE — Telephone Encounter (Signed)
Patient reported no difficulty using PDM or wearing the pod.  She reports that her FBS today was 120 and acL 105.  Denies low blood sugars.  She was reminded to call Dr. Harvel Ricks office to schedule a visit with her in 2 weeks.  She agreed to do this and had no final questions.

## 2020-06-23 LAB — BASIC METABOLIC PANEL
Calcium: 9.2 mg/dL (ref 8.9–10.3)
Chloride: 112 mmol/L — ABNORMAL HIGH (ref 98–111)
Glucose, Bld: 161 mg/dL — ABNORMAL HIGH (ref 70–99)
Potassium: 3.6 mmol/L (ref 3.5–5.1)
Sodium: 134 mmol/L — ABNORMAL LOW (ref 135–145)

## 2020-06-28 ENCOUNTER — Telehealth: Payer: Self-pay | Admitting: Nutrition

## 2020-06-28 NOTE — Telephone Encounter (Signed)
LVM  To call me to

## 2020-07-03 ENCOUNTER — Emergency Department (HOSPITAL_COMMUNITY): Payer: No Typology Code available for payment source

## 2020-07-03 ENCOUNTER — Emergency Department (HOSPITAL_COMMUNITY)
Admission: EM | Admit: 2020-07-03 | Discharge: 2020-07-03 | Disposition: A | Discharge status: Home / Self Care | Payer: No Typology Code available for payment source | Attending: Emergency Medicine | Admitting: Emergency Medicine

## 2020-07-03 ENCOUNTER — Encounter (HOSPITAL_COMMUNITY): Payer: Self-pay

## 2020-07-03 DIAGNOSIS — E109 Type 1 diabetes mellitus without complications: Secondary | ICD-10-CM | POA: Diagnosis not present

## 2020-07-03 DIAGNOSIS — M542 Cervicalgia: Secondary | ICD-10-CM | POA: Insufficient documentation

## 2020-07-03 DIAGNOSIS — Y92410 Unspecified street and highway as the place of occurrence of the external cause: Secondary | ICD-10-CM | POA: Diagnosis not present

## 2020-07-03 DIAGNOSIS — M25532 Pain in left wrist: Secondary | ICD-10-CM

## 2020-07-03 DIAGNOSIS — R519 Headache, unspecified: Secondary | ICD-10-CM | POA: Diagnosis not present

## 2020-07-03 MED ORDER — ACETAMINOPHEN 325 MG PO TABS
650.0000 mg | ORAL_TABLET | Freq: Once | ORAL | Status: AC
  Administered 2020-07-03: 650 mg via ORAL
  Filled 2020-07-03: qty 2

## 2020-07-03 MED ORDER — METHOCARBAMOL 500 MG PO TABS: 500.0000 mg | ORAL_TABLET | Freq: Three times a day (TID) | ORAL | 0 refills | Status: DC | PRN
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
## 2020-07-05 ENCOUNTER — Ambulatory Visit (INDEPENDENT_AMBULATORY_CARE_PROVIDER_SITE_OTHER): Payer: No Typology Code available for payment source | Admitting: Internal Medicine

## 2020-07-05 ENCOUNTER — Other Ambulatory Visit: Payer: Self-pay

## 2020-07-05 ENCOUNTER — Encounter: Payer: Self-pay | Admitting: Internal Medicine

## 2020-07-05 VITALS — BP 120/80 | HR 80 | Ht 66.0 in | Wt 198.2 lb

## 2020-07-05 DIAGNOSIS — E1065 Type 1 diabetes mellitus with hyperglycemia: Secondary | ICD-10-CM | POA: Diagnosis not present

## 2020-07-05 LAB — POCT GLYCOSYLATED HEMOGLOBIN (HGB A1C): Hemoglobin A1C: 9.6 % — AB (ref 4.0–5.6)

## 2020-07-05 MED ORDER — INSULIN ASPART 100 UNIT/ML FLEXPEN: PEN_INJECTOR | SUBCUTANEOUS | 6 refills | Status: DC

## 2020-07-05 NOTE — Progress Notes (Signed)
Name: Susan [name]  Age/ Sex: 20 y.o., female   MRN/ DOB: [id number], [birth year]     PCP: Pcp, No   Reason for Endocrinology Evaluation: Type 1 Diabetes Mellitus  Initial Endocrine Consultative Visit: 06/09/2020    PATIENT IDENTIFIER: [name] is a 20 y.o. female with a past medical history of T1DM. The patient has followed with Endocrinology clinic since 06/09/2020 for consultative assistance with management of her diabetes.  DIABETIC HISTORY:  Ms. Coia was diagnosed with DM in 2020. Was initially on insulin while in [place], per pt was taken off due to low A1c but restarted on insulin during hospitalization 05/2020. Insulin pump started 06/2020 . Her hemoglobin A1c has ranged from 10.1% in 2020, peaking at 10.3% in 2022.   On her initial visit she had an A1c of 10.3 % , we adjusted MDI regimen and was started on Omnipod in 06/2020  SUBJECTIVE:   During the last visit (06/09/2020 ): A1c 10.3% we adjusted MDI regimen   Today (07/05/2020): [name] is here for a follow up on diabetes management.  She checks her blood sugars 3 times daily. The patient has  had hypoglycemic episodes since the last clinic visit, which typically occur 1 x / - most often occuring during the day. The patient is  symptomatic with these episodes   Studies biomedical engineering    This patient with type 1 diabetes is treated with Omnipod (insulin pump). During the visit the pump basal and bolus doses were reviewed including carb/insulin rations and supplemental doses. The clinical list was updated. The glucose meter download was reviewed in detail to determine if the current pump settings are providing the best glycemic control without excessive hypoglycemia.  Pump and meter download:    Pump   Omnipod Settings   Insulin type   Novolog   Basal rate       0000  0.8 u/h               I:C ratio       0000 1:15                  Sensitivity       0000  30       Goal       0000  100             Type & Model of Pump: Omnipod Insulin Type: Currently using Novolog .   PUMP STATISTICS: Average BG: 119  BG Readings: 2.9 / day Average Daily Carbs (g): 203.3 Average Total Daily Insulin: 31.5 Average Daily Basal: 18.1 (57 %) Average Daily Bolus: 13.4 (43 %)       HOME DIABETES REGIMEN:  Novolog   Statin: no ACE-I/ARB: no Prior Diabetic Education: yes    CONTINUOUS GLUCOSE MONITORING RECORD INTERPRETATION    Dates of Recording: 5/4-5/17/2022  Sensor description:dexcom  Results statistics:   CGM use % of time 100  Average and SD 128/36  Time in range   91     %  % Time Above 180 8  % Time above 250 <1  % Time Below target <1     Glycemic patterns summary: optimaly BG's through the day and night   Hyperglycemic episodes  Late at night   Hypoglycemic episodes occurred during the day   Overnight periods: stable       DIABETIC COMPLICATIONS: Microvascular complications:    Denies: CKD, neuropathy, retinopathy   Last Eye Exam: Completed 09/2019  Macrovascular complications:  Denies: CAD, CVA, PVD   HISTORY:  Past Medical History:  Past Medical History:  Diagnosis Date  . Diabetes [facility])      Past Surgical History: No past surgical history on file.  Social History:  reports that she has never smoked. She has never used smokeless tobacco. She reports that she does not drink alcohol and does not use drugs. Family History:  Family History  Problem Relation Age of Onset  . Diabetes Mother   . Diabetes Father       HOME MEDICATIONS: Allergies as of 07/05/2020   No Known Allergies     Medication List       Accurate as of Jul 05, 2020 12:58 PM. If you have any questions, ask your nurse or doctor.        blood glucose meter kit and supplies Dispense based on patient and insurance preference. Use up to four times daily as directed. (FOR ICD-10 E10.9, E11.9).   cetirizine 10 MG tablet Commonly known as: ZYRTEC Take  10 mg by mouth daily.   Dexcom G6 Sensor Misc 1 Device by Does not apply route as directed.   Dexcom G6 Transmitter Misc 1 Device by Does not apply route as directed.   insulin aspart 100 UNIT/ML FlexPen Commonly known as: NOVOLOG Max daily 60 units   insulin glargine 100 UNIT/ML Solostar Pen Commonly known as: LANTUS Inject 26 Units into the skin daily.   Insulin Pen Needle 30G X 5 MM Misc 1 Device by Does not apply route in the morning, at noon, in the evening, and at bedtime.   ketoconazole 2 % shampoo Commonly known as: NIZORAL Apply 1 application topically every 14 (fourteen) days.   magnesium oxide 400 (241.3 Mg) MG tablet Commonly known as: MAG-OX Take 1 tablet (400 mg total) by mouth daily.   methocarbamol 500 MG tablet Commonly known as: ROBAXIN Take 1 tablet (500 mg total) by mouth every 8 (eight) hours as needed for muscle spasms.   Omnipod DASH Pods (Gen 4) Misc 1 Device by Does not apply route every other day.   ondansetron 4 MG tablet Commonly known as: Zofran Take 1 tablet (4 mg total) by mouth daily as needed for nausea or vomiting.        OBJECTIVE:   Vital Signs: LMP 06/05/2020 (Exact Date)   Wt Readings from Last 3 Encounters:  07/03/20 196 lb (88.9 kg)  06/10/20 192 lb (87.1 kg)  06/09/20 192 lb 2 oz (87.1 kg)     Exam: General: Pt appears well and is in NAD  Lungs: Clear with good BS bilat with no rales, rhonchi, or wheezes  Heart: RRR with normal S1 and S2 and no gallops; no murmurs; no rub  Abdomen: Normoactive bowel sounds, soft, nontender, without masses or organomegaly palpable  Extremities: No pretibial edema.  Neuro: MS is good with appropriate affect, pt is alert and Ox3    DM foot exam: 06/09/2020   The skin of the feet is intact without sores or ulcerations. The pedal pulses are 2+ on right and 2+ on left. The sensation is intact to a screening 5.07, 10 gram monofilament bilaterally    DATA REVIEWED:  Lab Results   Component Value Date   HGBA1C 10.3 (H) 04/18/2020   HGBA1C 10.1 (H) 12/03/2018   Lab Results  Component Value Date   CREATININE 0.58 06/01/2020    ASSESSMENT / PLAN / RECOMMENDATIONS:   1) Type 1 Diabetes Mellitus, with improving glycemic  control, Without  complications - Most recent A1c of 9.6 %. Goal A1c < 7.0%.    - She is doing very well with the Omnipod. She has been bolusing and entering Bg's appropriately. She has been noted with tight BG's , will reduce the basal rate slightly   - I have encouraged her to bolus for snacks if eaten      MEDICATIONS:  Novolog      Pump   Omnipod Settings   Insulin type   Novolog   Basal rate       0000  0.75 u/h               I:C ratio       0000 1:15                  Sensitivity       0000  30       Goal       0000  100         EDUCATION / INSTRUCTIONS:  BG monitoring instructions: Patient is instructed to check her blood sugars 4 times a day, before meals and bedtime   [facility] if: BG persistently < 70  . I reviewed the Rule of 15 for the treatment of hypoglycemia in detail with the patient. Literature supplied.   2) Diabetic complications:   Eye: Does not have known diabetic retinopathy.   Neuro/ Feet: Does not have known diabetic peripheral neuropathy .   Renal: Patient does not have known baseline CKD. She   is not on an ACEI/ARB at present.    3) Lipids: No indication for statin therapy .    F/U in 3 months    Signed electronically by: Mack Guise, MD  [facility]  [facility] [address] [place], [place] [address] Phone: [phone] FAX: [phone]   CC: Pcp, No No address on file Phone: None  Fax: None  Return to Endocrinology clinic as below: Future Appointments  Date Time Provider [place]  07/05/2020  3:40 PM Doyl Bitting, Melanie Crazier, MD [facility] [place]  09/28/2020 10:10 AM [place] Lippert,  Melanie Crazier, MD [facility] None

## 2020-07-05 NOTE — Patient Instructions (Signed)
    Pump   Omnipod Settings   Insulin type   Novolog   Basal rate       0000  0.75 u/h               I:C ratio       0000 1:15                  Sensitivity       0000  30       Goal       0000  100

## 2020-07-06 MED ORDER — INSULIN ASPART 100 UNIT/ML IJ SOLN: INTRAMUSCULAR | 6 refills | Status: DC

## 2020-07-12 ENCOUNTER — Encounter: Payer: Self-pay | Admitting: Internal Medicine

## 2020-09-22 ENCOUNTER — Encounter: Payer: Self-pay | Admitting: Internal Medicine

## 2020-09-28 ENCOUNTER — Ambulatory Visit: Payer: No Typology Code available for payment source | Admitting: Internal Medicine

## 2020-10-11 ENCOUNTER — Ambulatory Visit: Payer: No Typology Code available for payment source | Admitting: Internal Medicine

## 2020-10-12 ENCOUNTER — Encounter: Payer: Self-pay | Admitting: Internal Medicine

## 2020-10-12 ENCOUNTER — Other Ambulatory Visit: Payer: Self-pay

## 2020-10-12 ENCOUNTER — Ambulatory Visit (INDEPENDENT_AMBULATORY_CARE_PROVIDER_SITE_OTHER): Payer: No Typology Code available for payment source | Admitting: Internal Medicine

## 2020-10-12 VITALS — BP 114/78 | HR 91 | Ht 66.0 in | Wt 214.8 lb

## 2020-10-12 DIAGNOSIS — L680 Hirsutism: Secondary | ICD-10-CM | POA: Diagnosis not present

## 2020-10-12 DIAGNOSIS — E1065 Type 1 diabetes mellitus with hyperglycemia: Secondary | ICD-10-CM

## 2020-10-12 LAB — BASIC METABOLIC PANEL
BUN: 12 mg/dL (ref 6–23)
CO2: 23 mEq/L (ref 19–32)
Calcium: 9.6 mg/dL (ref 8.4–10.5)
Chloride: 103 mEq/L (ref 96–112)
Creatinine, Ser: 0.87 mg/dL (ref 0.40–1.20)
GFR: 95.32 mL/min (ref >60.00)
Glucose, Bld: 80 mg/dL (ref 70–99)
Potassium: 3.8 mEq/L (ref 3.5–5.1)
Sodium: 136 mEq/L (ref 135–145)

## 2020-10-12 LAB — TSH: TSH: 2.54 u[IU]/mL (ref 0.35–5.50)

## 2020-10-12 LAB — POCT GLYCOSYLATED HEMOGLOBIN (HGB A1C): Hemoglobin A1C: 5.4 % (ref 4.0–5.6)

## 2020-10-12 LAB — T4, FREE: Free T4: 0.78 ng/dL (ref 0.60–1.60)

## 2020-10-12 NOTE — Patient Instructions (Addendum)
     Pump   Omnipod Settings   Insulin type   Novolog   Basal rate       0000  0.65 u/h               I:C ratio       0000 1:15                  Sensitivity       0000  30       Goal       0000  100        24-Hour Urine Collection  You will be collecting your urine for a 24-hour period of time. Your timer starts with your first urine of the morning (For example - If you first pee at 9AM, your timer will start at 9AM) Throw away your first urine of the morning Collect your urine every time you pee for the next 24 hours STOP your urine collection 24 hours after you started the collection (For example - You would stop at 9AM the day after you started)

## 2020-10-12 NOTE — Progress Notes (Addendum)
Name: Susan [name] Susan [name] [name]  Age/ Sex: 20 y.o., female   MRN/ DOB: [id number], 04-09-2000     PCP: Pcp, No   Reason for Endocrinology Evaluation: Type 1 Diabetes Mellitus  Initial Endocrine Consultative Visit: 06/09/2020    Susan [name] [name] IDENTIFIER: Susan [name] Susan [name] [name] is a 20 y.o. female with a past medical history of T1DM. Susan [name] Susan [name] [name] has followed with Endocrinology clinic since 06/09/2020 for consultative assistance with management of her diabetes.  DIABETIC HISTORY:  Susan [name] Susan [name] [name] was diagnosed with DM in 2020. Was initially on insulin while in [place], per pt was taken off due to low A1c but restarted on insulin during hospitalization 05/2020. Insulin pump started 06/2020 . Her hemoglobin A1c has ranged from 10.1% in 2020, peaking at 10.3% in 2022.   On her initial visit she had an A1c of 10.3 % , we adjusted MDI regimen and was started on Omnipod in 06/2020  SUBJECTIVE:   During Susan [name] last visit (07/05/2020 ): A1c 9.6% we continued pump settings   Today (10/12/2020): Susan [name] Susan [name] [name] is here for a follow up on diabetes management.  She checks her blood sugars 3 times daily. Susan [name] Susan [name] [name] has  had hypoglycemic episodes since Susan [name] last clinic visit, which typically occur 1 x / - most often occuring during Susan [name] day. Susan [name] Susan [name] [name] is  symptomatic with these episodes   Studies biomedical engineering  Denies nausea or diarrhea    She has been noted with hirsutism over Susan [name] chin and neck area as well as chest , has been stable over Susan [name] past years . She doesn't;t always shave it   LMP 8/4th, 2022 regular   This Susan [name] [name] with type 1 diabetes is treated with Omnipod (insulin pump). During Susan [name] visit Susan [name] pump basal and bolus doses were reviewed including carb/insulin rations and supplemental doses. Susan [name] clinical list was updated. Susan [name] glucose meter download was reviewed in detail to determine if Susan [name] current pump settings are providing Susan [name] best glycemic control without excessive hypoglycemia.  Pump and meter download:    Pump    Omnipod Settings   Insulin type   Novolog   Basal rate       0000  0.70 u/h               I:C ratio       0000 1:15                  Sensitivity       0000  30       Goal       0000  100            Type & Model of Pump: Omnipod Insulin Type: Currently using Novolog .   PUMP STATISTICS: Average BG: 119  BG Readings: 1.1 / day Average Daily Carbs (g): 98 Average Total Daily Insulin: 19.8 Average Daily Basal: 15.4 (84 %) Average Daily Bolus: 2.9 (16 %)       HOME DIABETES REGIMEN:  Novolog   Statin: no ACE-I/ARB: no Prior Diabetic Education: yes    CONTINUOUS GLUCOSE MONITORING RECORD INTERPRETATION    Dates of Recording: 8/11 - 10/12/2020  Sensor description:dexcom  Results statistics:   CGM use % of time 93  Average and SD 113/22  Time in range   98  %  % Time Above 180 <1  % Time above 250 0  % Time Below target <1     Glycemic patterns summary: optimaly BG's through Susan [name] day and night   Hyperglycemic episodes  Late  at night   Hypoglycemic episodes occurred during Susan [name] day   Overnight periods: stable       DIABETIC COMPLICATIONS: Microvascular complications:   Denies: CKD, neuropathy, retinopathy  Last Eye Exam: Completed 09/2019  Macrovascular complications:   Denies: CAD, CVA, PVD   HISTORY:  Past Medical History:  Past Medical History:  Diagnosis Date   Diabetes ([place])    Past Surgical History: No past surgical history on file. Social History:  reports that she has never smoked. She has never used smokeless tobacco. She reports that she does not drink alcohol and does not use drugs. Family History:  Family History  Problem Relation Age of Onset   Diabetes Mother    Diabetes Father      HOME MEDICATIONS: Allergies as of 10/12/2020   No Known Allergies      Medication List        Accurate as of October 12, 2020  8:56 AM. If you have any questions, ask your nurse or doctor.          blood glucose  meter kit and supplies Dispense based on Susan [name] [name] and insurance preference. Use up to four times daily as directed. (FOR ICD-10 E10.9, E11.9).   cetirizine 10 MG tablet Commonly known as: ZYRTEC Take 10 mg by mouth daily.   Dexcom G6 Sensor Misc 1 Device by Does not apply route as directed.   Dexcom G6 Transmitter Misc 1 Device by Does not apply route as directed.   insulin aspart 100 UNIT/ML injection Commonly known as: novoLOG Max daily 60 units.   Insulin Pen Needle 30G X 5 MM Misc 1 Device by Does not apply route in Susan [name] morning, at noon, in Susan [name] evening, and at bedtime.   ketoconazole 2 % shampoo Commonly known as: NIZORAL Apply 1 application topically every 14 (fourteen) days.   magnesium oxide 400 (241.3 Mg) MG tablet Commonly known as: MAG-OX Take 1 tablet (400 mg total) by mouth daily.   methocarbamol 500 MG tablet Commonly known as: ROBAXIN Take 1 tablet (500 mg total) by mouth every 8 (eight) hours as needed for muscle spasms.   Omnipod DASH Pods (Gen 4) Misc 1 Device by Does not apply route every other day.   ondansetron 4 MG tablet Commonly known as: Zofran Take 1 tablet (4 mg total) by mouth daily as needed for nausea or vomiting.         OBJECTIVE:   Vital Signs: BP 114/78   Pulse 91   Ht '5\' 6"'  (1.676 m)   Wt 214 lb 12.8 oz (97.4 kg)   SpO2 99%   BMI 34.67 kg/m   Wt Readings from Last 3 Encounters:  10/12/20 214 lb 12.8 oz (97.4 kg)  07/05/20 198 lb 4 oz (89.9 kg)  07/03/20 196 lb (88.9 kg)     Exam: General: Pt appears well and is in NAD Hirsutism noted over Susan [name] chin, neck, breast, abdomen and chest   Dark colored striae over Susan [name] flanks bilaterally   Lungs: Clear with good BS bilat with no rales, rhonchi, or wheezes  Heart: RRR with normal S1 and S2 and no gallops; no murmurs; no rub  Abdomen: Normoactive bowel sounds, soft, nontender, without masses or organomegaly palpable  Extremities: No pretibial edema.  Neuro: MS is good with  appropriate affect, pt is alert and Ox3    DM foot exam: 06/09/2020     Susan [name] skin of Susan [name] feet is intact without sores or ulcerations. Susan [name] pedal pulses are 2+  on right and 2+ on left. Susan [name] sensation is intact to a screening 5.07, 10 gram monofilament bilaterally    DATA REVIEWED:  Lab Results  Component Value Date   HGBA1C 5.4 10/12/2020   HGBA1C 9.6 (A) 07/05/2020   HGBA1C 10.3 (H) 04/18/2020   Results for SHAMICA, MOREE (MRN [id number]) as of 10/13/2020 13:23  Ref. Range 10/12/2020 09:14  Sodium Latest Ref Range: 135 - 145 mEq/L 136  Potassium Latest Ref Range: 3.5 - 5.1 mEq/L 3.8  Chloride Latest Ref Range: 96 - 112 mEq/L 103  CO2 Latest Ref Range: 19 - 32 mEq/L 23  Glucose Latest Ref Range: 70 - 99 mg/dL 80  BUN Latest Ref Range: 6 - 23 mg/dL 12  Creatinine Latest Ref Range: 0.40 - 1.20 mg/dL 0.87  Calcium Latest Ref Range: 8.4 - 10.5 mg/dL 9.6  GFR Latest Ref Range: >60.00 mL/min 95.32  DHEA-SO4 Latest Ref Range: 44 - 286 mcg/dL 142  TSH Latest Ref Range: 0.35 - 5.50 uIU/mL 2.54  T4,Free(Direct) Latest Ref Range: 0.60 - 1.60 ng/dL 0.78    ASSESSMENT / PLAN / RECOMMENDATIONS:   1) Type 1 Diabetes Mellitus, with improving glycemic control, Without complications - Most recent A1c of 5.4%. Goal A1c < 7.0%.    -In review of her pump download Susan [name] Susan [name] [name] has been noted to mainly depend on her basal rate.  She rarely ever boluses. -I am going to reduce her basal rate, and I have strongly encouraged her to start bolusing for meals, and snacks by entering CHO intake. -Her A1c is very low at 5.4% and I am concerned that there is recurrent hypoglycemia, there is evidence of hypoglycemia on her CGM download as well as tight BG's.   MEDICATIONS: Novolog      Pump   Omnipod Settings   Insulin type   Novolog   Basal rate       0000  0.65 u/h               I:C ratio       0000 1:15                  Sensitivity       0000  30       Goal       0000  100          EDUCATION / INSTRUCTIONS: BG monitoring instructions: Susan [name] [name] is instructed to check her blood sugars 4 times a day, before meals and bedtime  Call [facility] if: BG persistently < 70  I reviewed Susan [name] Rule of 15 for Susan [name] treatment of hypoglycemia in detail with Susan [name] Susan [name] [name]. Literature supplied.   2) Diabetic complications:  Eye: Does not have known diabetic retinopathy.  Neuro/ Feet: Does not have known diabetic peripheral neuropathy .  Renal: Susan [name] [name] does not have known baseline CKD. She   is not on an ACEI/ARB at present.    3) Lipids: No indication for statin therapy .  4) Hirsutism :  -Hirsutism has been stable since high school it is not bothersome to her at this point, but I do want to make sure there is no hormonal imbalance causing this. -DHEA-S is normal -Testosterone is pending as well as 17 hydroxyprogesterone -We will proceed with Cushing syndrome screening through 24-hour urine collection    F/U in 3 months    Signed electronically by: Mack Guise, MD  [facility]  [address] [place], [place]  [address] Phone: [phone] FAX: [phone]   CC: Pcp, No No address on file Phone: None  Fax: None  Return to Endocrinology clinic as below: No future appointments.

## 2020-10-16 LAB — 17-HYDROXYPROGESTERONE: 17-OH-Progesterone, LC/MS/MS: 181 ng/dL

## 2020-10-16 LAB — DHEA-SULFATE: DHEA-SO4: 142 ug/dL (ref 44–286)

## 2020-10-16 LAB — TESTOSTERONE, TOTAL, LC/MS/MS: Testosterone, Total, LC-MS-MS: 37 ng/dL (ref 2–45)

## 2020-10-19 ENCOUNTER — Other Ambulatory Visit: Payer: No Typology Code available for payment source

## 2020-10-19 ENCOUNTER — Other Ambulatory Visit: Payer: Self-pay

## 2020-10-19 DIAGNOSIS — L680 Hirsutism: Secondary | ICD-10-CM

## 2020-10-26 LAB — CORTISOL, URINE, 24 HOUR
24 Hour urine volume (VMAHVA): 1150 mL
CREATININE, URINE: 1.47 g/(24.h) (ref 0.50–2.15)
Cortisol (Ur), Free: 18.2 mcg/24 h (ref 4.0–50.0)

## 2020-12-15 ENCOUNTER — Encounter: Payer: Self-pay | Admitting: Internal Medicine

## 2020-12-16 ENCOUNTER — Other Ambulatory Visit: Payer: Self-pay

## 2020-12-16 DIAGNOSIS — E1065 Type 1 diabetes mellitus with hyperglycemia: Secondary | ICD-10-CM

## 2020-12-16 MED ORDER — DEXCOM G6 SENSOR MISC: 1.0000 | 3 refills | Status: DC

## 2020-12-16 MED ORDER — OMNIPOD DASH PODS (GEN 4) MISC: 1.0000 | 11 refills | Status: DC

## 2020-12-16 NOTE — Telephone Encounter (Signed)
Script sent  

## 2020-12-20 ENCOUNTER — Other Ambulatory Visit (HOSPITAL_COMMUNITY): Payer: Self-pay

## 2020-12-20 ENCOUNTER — Telehealth: Payer: Self-pay | Admitting: Pharmacy Technician

## 2020-12-20 NOTE — Telephone Encounter (Signed)
Patient Advocate Encounter  Received notification from [employer] that prior authorization for OMNIPOD DASH PODS [facility]) is required.   PA submitted on 11.1.22 Key [id number] Status is pending   [facility] will continue to follow  Ricke Hey, CPhT Patient Advocate [facility] Phone: [phone] Fax:  [phone]

## 2020-12-20 NOTE — Telephone Encounter (Signed)
Patient Advocate Encounter  Received notification from [employer] that prior authorization for [facility] G6 SENSOR is required.   PA submitted on 11.1.22 Key [id number] Status is pending   [facility] will continue to follow  Ricke Hey, CPhT Patient Advocate [facility] Phone: [phone] Fax:  [phone]

## 2020-12-21 NOTE — Telephone Encounter (Signed)
Submitted PA on 11.1.22. Please see encounter.

## 2020-12-26 ENCOUNTER — Other Ambulatory Visit (HOSPITAL_COMMUNITY): Payer: Self-pay

## 2020-12-26 NOTE — Telephone Encounter (Signed)
Patient Advocate Encounter  Prior Authorization for Dexcom has been Temporarily approved.    PA# ref# [id number] Effective dates: 12/26/20 through 01/25/21  To continue treatment after this approval ends, you must meet the following: Your doctor must show you have been using your device most of the time (log data shows the device is being worn at least 80% of the time).  Per Test Claim Patients co-pay is $3.40 for sensors.   Sherilyn Dacosta, CPhT Patient Advocate [facility] Phone: [phone] Fax:  [phone]

## 2020-12-26 NOTE — Telephone Encounter (Signed)
Patient Advocate Encounter  Prior Authorization for [employer] (Gen 4) has been approved.    PA# no PA # given. Effective dates: 12/26/20 through 12/26/21  Per Test Claim Patients co-pay is $3.40.   Sherilyn Dacosta, CPhT Patient Advocate [facility] Phone: [phone] Fax:  [phone]

## 2021-02-09 ENCOUNTER — Telehealth: Payer: Self-pay

## 2021-02-09 ENCOUNTER — Other Ambulatory Visit (HOSPITAL_COMMUNITY): Payer: Self-pay

## 2021-02-09 NOTE — Telephone Encounter (Signed)
Patient Advocate Encounter   Received notification from the patient's pharmacy that prior authorization for Dexcom sensors is required by his/her insurance [employer] [place].   PA renewal submitted on 02/09/21  Key#: [id number]  Status is pending    [facility] will continue to follow:  Patient Advocate Fax:  [phone]

## 2021-02-10 NOTE — Telephone Encounter (Signed)
Called and left a detailed message for pt explaining the insurance is requiring she use her Dexcom at least 80% of the time to ensure it will be covered. MyChart message sent to pt as well with information.

## 2021-02-15 NOTE — Telephone Encounter (Signed)
Patient Advocate Encounter  Received notification from [facility] [place] that the request for prior authorization for Dexcom G6 Sensors has been denied due to the patient not using her Dexcom at least 80% of the time.    Waiting for the patient to give the provider a return phone call regarding this.   This encounter will continue to be updated until final determination.     Specialty Pharmacy Patient Advocate Fax:  [phone]

## 2021-02-22 ENCOUNTER — Ambulatory Visit (INDEPENDENT_AMBULATORY_CARE_PROVIDER_SITE_OTHER): Payer: Medicaid - Out of State | Admitting: Internal Medicine

## 2021-02-22 ENCOUNTER — Encounter: Payer: Self-pay | Admitting: Internal Medicine

## 2021-02-22 ENCOUNTER — Other Ambulatory Visit: Payer: Self-pay

## 2021-02-22 VITALS — BP 122/76 | HR 78 | Ht 66.0 in | Wt 219.0 lb

## 2021-02-22 DIAGNOSIS — E1065 Type 1 diabetes mellitus with hyperglycemia: Secondary | ICD-10-CM

## 2021-02-22 LAB — POCT GLYCOSYLATED HEMOGLOBIN (HGB A1C): Hemoglobin A1C: 5.8 % — AB (ref 4.0–5.6)

## 2021-02-22 MED ORDER — OMNIPOD 5 DEXG7G6 PODS GEN 5 MISC: 1.0000 | 3 refills | Status: DC

## 2021-02-22 MED ORDER — OMNIPOD 5 DEXG7G6 INTRO GEN 5 KIT: 1.0000 | PACK | 0 refills | Status: DC

## 2021-02-22 NOTE — Patient Instructions (Signed)
° ° ° °  Pump   Omnipod Settings   Insulin type   Novolog   Basal rate       0000  0.65 u/h    10000 0.60          I:C ratio       0000 1:15                  Sensitivity       0000  30       Goal       0000  100

## 2021-02-22 NOTE — Progress Notes (Signed)
Name: Susan [name]  Age/ Sex: 21 y.o., female   MRN/ DOB: [id number], [birth year]     PCP: Pcp, No   Reason for Endocrinology Evaluation: Type 1 Diabetes Mellitus  Initial Endocrine Consultative Visit: 06/09/2020    PATIENT IDENTIFIER: Susan [name] is a 21 y.o. female with a past medical history of T1DM. The patient has followed with Endocrinology clinic since 06/09/2020 for consultative assistance with management of her diabetes.  DIABETIC HISTORY:  [name] was diagnosed with DM in 2020. Was initially on insulin while in [place], per pt was taken off due to low A1c but restarted on insulin during hospitalization 05/2020. Insulin pump started 06/2020 . Her hemoglobin A1c has ranged from 10.1% in 2020, peaking at 10.3% in 2022.   On her initial visit she had an A1c of 10.3 % , we adjusted MDI regimen and was started on Omnipod in 06/2020     IDIOPATHIC HIRSUTISM: Testosterone, 17-OH progesterone, TFT's , DHEAS and 24-hr urinary cortisol have come back all normal 09/2020  SUBJECTIVE:   During the last visit (10/12/2020 ): A1c 5.4% we continued pump settings   Today (02/22/2021): [name] is here for a follow up on diabetes management.  She checks her blood sugars multiple  times daily through CGM. The patient has  had hypoglycemic episodes since the last clinic visit, which typically occur 1 x / - most often occuring during the day. 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 During the visit the pump basal and bolus doses were reviewed including carb/insulin rations and supplemental doses. The clinical list was updated. The glucose meter download was reviewed in detail to determine if the current pump settings are providing the best glycemic control without excessive hypoglycemia.  Pump and meter download:    Pump   Omnipod  Settings   Insulin type   Novolog   Basal rate       0000  0.65 u/h               I:C ratio       0000 1:15                  Sensitivity       0000  30       Goal       0000  100            Type & Model of Pump: Omnipod Insulin Type: Currently using Novolog .   PUMP STATISTICS: Average BG: 83 BG Readings: 0.6 / day Average Daily Carbs (g): 62.8 Average Total Daily Insulin: 17.6 Average Daily Basal: 14.2 (81 %) Average Daily Bolus: 3.4 (3.4 %)       HOME DIABETES REGIMEN:  Novolog   Statin: no ACE-I/ARB: no Prior Diabetic Education: yes    CONTINUOUS GLUCOSE MONITORING RECORD INTERPRETATION    Dates of Recording: 12/22-02/22/2021  Sensor description:dexcom  Results statistics:   CGM use % of time 71  Average and SD 107/28  Time in range   91 %  % Time Above 180 <1  % Time above 250 0  % Time Below target 6     Glycemic patterns summary: optimaly BG's through the  night and hypoglycemia noted during the day   Hyperglycemic episodes  Late at night   Hypoglycemic episodes occurred during the day  Overnight periods: stable       DIABETIC COMPLICATIONS: Microvascular complications:   Denies: CKD, neuropathy, retinopathy  Last Eye Exam: Completed 06/2020  Macrovascular complications:   Denies: CAD, CVA, PVD   HISTORY:  Past Medical History:  Past Medical History:  Diagnosis Date   Diabetes ([place])    Past Surgical History: No past surgical history on file. Social History:  reports that she has never smoked. She has never used smokeless tobacco. She reports that she does not drink alcohol and does not use drugs. Family History:  Family History  Problem Relation Age of Onset   Diabetes Mother    Diabetes Father      HOME MEDICATIONS: Allergies as of 02/22/2021   No Known Allergies      Medication List        Accurate as of February 22, 2021 10:32 AM. If you have any questions, ask your nurse or doctor.           STOP taking these medications    blood glucose meter kit and supplies Stopped by: Dorita Sciara, MD   Omnipod DASH Pods (Gen 4) Misc Replaced by: Omnipod 5 G6 Intro (Gen 5) Kit Stopped by: Dorita Sciara, MD       TAKE these medications    cetirizine 10 MG tablet Commonly known as: ZYRTEC Take 10 mg by mouth daily.   Dexcom G6 Sensor Misc 1 Device by Does not apply route as directed.   Dexcom G6 Transmitter Misc 1 Device by Does not apply route as directed.   insulin aspart 100 UNIT/ML injection Commonly known as: novoLOG Max daily 60 units.   Insulin Pen Needle 30G X 5 MM Misc 1 Device by Does not apply route in the morning, at noon, in the evening, and at bedtime.   ketoconazole 2 % shampoo Commonly known as: NIZORAL Apply 1 application topically every 14 (fourteen) days.   magnesium oxide 400 (241.3 Mg) MG tablet Commonly known as: MAG-OX Take 1 tablet (400 mg total) by mouth daily.   methocarbamol 500 MG tablet Commonly known as: ROBAXIN Take 1 tablet (500 mg total) by mouth every 8 (eight) hours as needed for muscle spasms.   Omnipod 5 G6 Intro (Gen 5) Kit 1 Device by Does not apply route every 3 (three) days. Replaces: Omnipod DASH Pods (Gen 4) Misc Started by: Dorita Sciara, MD   Omnipod 5 G6 Pod (Gen 5) Misc 1 Device by Does not apply route every 3 (three) days. Started by: Dorita Sciara, MD   ondansetron 4 MG tablet Commonly known as: Zofran Take 1 tablet (4 mg total) by mouth daily as needed for nausea or vomiting.         OBJECTIVE:   Vital Signs: BP 122/76 (BP Location: Left Arm, Patient Position: Sitting, Cuff Size: Small)    Pulse 78    Ht '5\' 6"'  (1.676 m)    Wt 219 lb (99.3 kg)    SpO2 99%    BMI 35.35 kg/m   Wt Readings from Last 3 Encounters:  02/22/21 219 lb (99.3 kg)  10/12/20 214 lb 12.8 oz (97.4 kg)  07/05/20 198 lb 4 oz (89.9 kg)     Exam: General: Pt appears well and is in NAD  Lungs: Clear  with good BS bilat with no rales, rhonchi, or wheezes  Heart: RRR   Abdomen: Normoactive bowel sounds, soft, nontender, without masses or organomegaly palpable  Extremities: No pretibial edema.  Neuro: MS  is good with appropriate affect, pt is alert and Ox3    DM foot exam: 02/22/2021     The skin of the feet is intact without sores or ulcerations. The pedal pulses are 2+ on right and 2+ on left. The sensation is intact to a screening 5.07, 10 gram monofilament bilaterally    DATA REVIEWED:  Lab Results  Component Value Date   HGBA1C 5.8 (A) 02/22/2021   HGBA1C 5.4 10/12/2020   HGBA1C 9.6 (A) 07/05/2020   Results for KAMDYN, COVEL (MRN [id number]) as of 10/13/2020 13:23  Ref. Range 10/12/2020 09:14  Sodium Latest Ref Range: 135 - 145 mEq/L 136  Potassium Latest Ref Range: 3.5 - 5.1 mEq/L 3.8  Chloride Latest Ref Range: 96 - 112 mEq/L 103  CO2 Latest Ref Range: 19 - 32 mEq/L 23  Glucose Latest Ref Range: 70 - 99 mg/dL 80  BUN Latest Ref Range: 6 - 23 mg/dL 12  Creatinine Latest Ref Range: 0.40 - 1.20 mg/dL 0.87  Calcium Latest Ref Range: 8.4 - 10.5 mg/dL 9.6  GFR Latest Ref Range: >60.00 mL/min 95.32  DHEA-SO4 Latest Ref Range: 44 - 286 mcg/dL 142  TSH Latest Ref Range: 0.35 - 5.50 uIU/mL 2.54  T4,Free(Direct) Latest Ref Range: 0.60 - 1.60 ng/dL 0.78    ASSESSMENT / PLAN / RECOMMENDATIONS:   1) Type 1 Diabetes Mellitus, with improving glycemic control, Without complications - Most recent A1c of 5.8%. Goal A1c < 7.0%.    -In review of her pump download the patient has been bolusing appropriately. She tends to have hypoglycemia during the daytime, will reduce basal rate as below  - She is interested in switching to Omnipod 5 which I have sent to [facility]    MEDICATIONS: Novolog      Pump   Omnipod Settings   Insulin type   Novolog   Basal rate       0000  0.65 u/h    10000 0.60           I:C ratio       0000 1:15                  Sensitivity       0000   30       Goal       0000  100         EDUCATION / INSTRUCTIONS: BG monitoring instructions: Patient is instructed to check her blood sugars 4 times a day, before meals and bedtime  Call [facility] if: BG persistently < 70  I reviewed the Rule of 15 for the treatment of hypoglycemia in detail with the patient. Literature supplied.   2) Diabetic complications:  Eye: Does not have known diabetic retinopathy.  Neuro/ Feet: Does not have known diabetic peripheral neuropathy .  Renal: Patient does not have known baseline CKD. She   is not on an ACEI/ARB at present.    3) Lipids: No indication for statin therapy .     F/U in 6 months    Signed electronically by: Mack Guise, MD  [facility]  [facility] [address] [place], [place] [address] Phone: [phone] FAX: [phone]   CC: Pcp, No No address on file Phone: None  Fax: None  Return to Endocrinology clinic as below: No future appointments.

## 2021-03-27 ENCOUNTER — Telehealth: Payer: Self-pay

## 2021-03-27 NOTE — Telephone Encounter (Signed)
New PA will need to be submitted on Dexcom supplies before 07/02/21 when current approval expires.

## 2021-05-03 ENCOUNTER — Other Ambulatory Visit: Payer: Self-pay | Admitting: Internal Medicine

## 2021-05-03 DIAGNOSIS — E1065 Type 1 diabetes mellitus with hyperglycemia: Secondary | ICD-10-CM

## 2021-07-19 ENCOUNTER — Ambulatory Visit: Payer: PRIVATE HEALTH INSURANCE | Admitting: Internal Medicine

## 2021-08-03 NOTE — Telephone Encounter (Signed)
Received notification from  [employer]  regarding a prior authorization for  [employer] . Authorization has been APPROVED from 08/01/21 to 08/02/22.   Phone # [phone]

## 2021-08-08 ENCOUNTER — Other Ambulatory Visit: Payer: Self-pay | Admitting: Internal Medicine

## 2021-08-09 ENCOUNTER — Other Ambulatory Visit: Payer: Self-pay

## 2021-08-09 ENCOUNTER — Encounter: Payer: Self-pay | Admitting: Internal Medicine

## 2021-08-09 MED ORDER — INSULIN ASPART 100 UNIT/ML IJ SOLN: INTRAMUSCULAR | 3 refills | Status: DC

## 2021-08-15 MED ORDER — INSULIN LISPRO 100 UNIT/ML IJ SOLN: INTRAMUSCULAR | 6 refills | Status: DC

## 2021-08-24 ENCOUNTER — Ambulatory Visit: Payer: PRIVATE HEALTH INSURANCE | Admitting: Internal Medicine

## 2021-10-24 ENCOUNTER — Ambulatory Visit (HOSPITAL_COMMUNITY)
Admission: EM | Admit: 2021-10-24 | Discharge: 2021-10-24 | Disposition: A | Discharge status: Home / Self Care | Payer: BC Managed Care – PPO | Attending: Family Medicine | Admitting: Family Medicine

## 2021-10-24 ENCOUNTER — Ambulatory Visit (INDEPENDENT_AMBULATORY_CARE_PROVIDER_SITE_OTHER): Payer: BC Managed Care – PPO

## 2021-10-24 ENCOUNTER — Encounter (HOSPITAL_COMMUNITY): Payer: Self-pay | Admitting: Emergency Medicine

## 2021-10-24 DIAGNOSIS — M79674 Pain in right toe(s): Secondary | ICD-10-CM

## 2021-10-24 MED ORDER — KETOROLAC TROMETHAMINE 30 MG/ML IJ SOLN
30.0000 mg | Freq: Once | INTRAMUSCULAR | Status: AC
Start: 2021-10-24 — End: 2021-10-24
  Administered 2021-10-24: 30 mg via INTRAMUSCULAR

## 2021-10-24 MED ORDER — IBUPROFEN 800 MG PO TABS: 800.0000 mg | ORAL_TABLET | Freq: Three times a day (TID) | ORAL | 0 refills | Status: DC | PRN

## 2021-10-24 MED ORDER — KETOROLAC TROMETHAMINE 30 MG/ML IJ SOLN
INTRAMUSCULAR | Status: AC
  Filled 2021-10-24: qty 1

## 2021-10-24 NOTE — Discharge Instructions (Addendum)
Your x-ray did not show any broken bone  You have been given a shot of Toradol 30 mg today.  Take ibuprofen 800 mg--1 tab every 8 hours as needed for pain.  Follow-up with your regular doctors if this continues

## 2021-10-24 NOTE — ED Triage Notes (Signed)
Pt reports pain in her right little toe since this morning. States when walking she isn't able to put pressure on her toe. Denies any obvious injuries to toe.

## 2021-10-24 NOTE — ED Provider Notes (Addendum)
Susan [name]    CSN: [id number] Arrival date & time: 10/24/21  1038      History   Chief Complaint Chief Complaint  Patient presents with   Toe Pain       HPI Susan [name] is a 21 y.o. female.    Toe Pain   Here for pain in her right fourth toe.  She first noticed it this morning when she awoke.  She did not have any undue activities yesterday.  No trauma noted.  No fever or chills.  It mainly hurts when she puts pressure on it to walk.  Past medical history significant for type 1 diabetes  Past Medical History:  Diagnosis Date   Diabetes [facility])     Patient Active Problem List   Diagnosis Date Noted   Hirsutism 10/12/2020   DKA (diabetic ketoacidosis) ([place]) 04/18/2020   Hyponatremia 04/18/2020   Type 1 diabetes mellitus with hyperglycemia ([place]) 12/25/2018   Hypokalemia 12/05/2018   Hypophosphatemia 12/05/2018   DKA, type 1 ([place]) 12/03/2018    History reviewed. No pertinent surgical history.  OB History   No obstetric history on file.      Home Medications    Prior to Admission medications   Medication Sig Start Date End Date Taking? Authorizing Provider  ibuprofen (ADVIL) 800 MG tablet Take 1 tablet (800 mg total) by mouth every 8 (eight) hours as needed (pain). 10/24/21  Yes Barrett Henle, MD  cetirizine (ZYRTEC) 10 MG tablet Take 10 mg by mouth daily.    [provider]  Continuous Blood Gluc Sensor (DEXCOM G6 SENSOR) MISC 1 Device by Does not apply route as directed. 12/16/20   Shamleffer, Melanie Crazier, MD  Continuous Blood Gluc Transmit (DEXCOM G6 TRANSMITTER) MISC USE 1 DEVICE. DOES NOT APPLY ROUTE AS DIRECTED 05/03/21   Shamleffer, Melanie Crazier, MD  insulin aspart (NOVOLOG) 100 UNIT/ML injection INJECT UP TO 60 UNITS EVERY DAY 08/09/21   Shamleffer, Melanie Crazier, MD  Insulin Disposable Pump (OMNIPOD 5 G6 INTRO, GEN 5,) KIT 1 Device by Does not apply route every 3 (three) days. 02/22/21   Shamleffer, Melanie Crazier, MD   Insulin Disposable Pump (OMNIPOD 5 G6 POD, GEN 5,) MISC 1 Device by Does not apply route every 3 (three) days. 02/22/21   Shamleffer, Melanie Crazier, MD  insulin lispro (HUMALOG) 100 UNIT/ML injection Max daily 50 units 08/15/21   Shamleffer, Melanie Crazier, MD  Insulin Pen Needle 30G X 5 MM MISC 1 Device by Does not apply route in the morning, at noon, in the evening, and at bedtime. 06/09/20   Shamleffer, Melanie Crazier, MD  ketoconazole (NIZORAL) 2 % shampoo Apply 1 application topically every 14 (fourteen) days. 03/04/20   [provider]  magnesium oxide (MAG-OX) 400 (241.3 Mg) MG tablet Take 1 tablet (400 mg total) by mouth daily. 06/01/20   Pokhrel, Corrie Mckusick, MD  methocarbamol (ROBAXIN) 500 MG tablet Take 1 tablet (500 mg total) by mouth every 8 (eight) hours as needed for muscle spasms. Patient not taking: Reported on 02/22/2021 07/03/20   Loni Beckwith, PA-C    Family History Family History  Problem Relation Age of Onset   Diabetes Mother    Diabetes Father     Social History Social History   Tobacco Use   Smoking status: Never   Smokeless tobacco: Never  Vaping Use   Vaping Use: Never used  Substance Use Topics   Alcohol use: Never   Drug use: Never  Allergies   Patient has no known allergies.   Review of Systems Review of Systems   Physical Exam Triage Vital Signs ED Triage Vitals  Enc Vitals Group     BP 10/24/21 1152 107/72     Pulse Rate 10/24/21 1152 97     Resp 10/24/21 1152 17     Temp 10/24/21 1152 98.3 F (36.8 C)     Temp Source 10/24/21 1152 Oral     SpO2 10/24/21 1152 97 %     Weight --      Height --      Head Circumference --      Peak Flow --      Pain Score 10/24/21 1150 8     Pain Loc --      Pain Edu? --      Excl. in [place]? --    No data found.  Updated Vital Signs BP 107/72 (BP Location: Left Arm)   Pulse 97   Temp 98.3 F (36.8 C) (Oral)   Resp 17   SpO2 97%   Visual Acuity Right Eye Distance:   Left Eye  Distance:   Bilateral Distance:    Right Eye Near:   Left Eye Near:    Bilateral Near:     Physical Exam Vitals reviewed.  Constitutional:      General: She is not in acute distress.    Appearance: She is not ill-appearing, toxic-appearing or diaphoretic.  Musculoskeletal:     Comments: There is no erythema or swelling of the right fourth toe.  There is no tenderness when I put pressure on the toe pad or any part of the toe or on the MTP joint.  There is no ulceration.  Pulse in that foot is normal  Skin:    Coloration: Skin is not pale.  Neurological:     General: No focal deficit present.     Mental Status: She is alert and oriented to person, place, and time.  Psychiatric:        Behavior: Behavior normal.      UC Treatments / Results  Labs (all labs ordered are listed, but only abnormal results are displayed) Labs Reviewed - No data to display  EKG   Radiology DG Toe 4th Right  Result Date: 10/24/2021 CLINICAL DATA:  Pain in RIGHT fourth toe, no history of trauma. EXAM: RIGHT FOURTH TOE COMPARISON:  None Available. FINDINGS: There is no evidence of fracture or dislocation. There is no evidence of arthropathy or other focal bone abnormality. Soft tissues are unremarkable. IMPRESSION: Negative. Electronically Signed   By: Zetta Bills M.D.   On: 10/24/2021 13:06    Procedures Procedures (including critical care time)  Medications Ordered in UC Medications  ketorolac (TORADOL) 30 MG/ML injection 30 mg (has no administration in time range)    Initial Impression / Assessment and Plan / UC Course  I have reviewed the triage vital signs and the nursing notes.  Pertinent labs & imaging results that were available during my care of the patient were reviewed by me and considered in my medical decision making (see chart for details).     X-ray is negative.  We will treat with Toradol and ibuprofen.  There is no neuropathic quality to this.  She will let her regular  doctors know if this continues. Final Clinical Impressions(s) / UC Diagnoses   Final diagnoses:  Pain of toe of right foot     Discharge Instructions  Your x-ray did not show any broken bone  You have been given a shot of Toradol 30 mg today.  Take ibuprofen 800 mg--1 tab every 8 hours as needed for pain.  Follow-up with your regular doctors if this continues     ED Prescriptions     Medication Sig Dispense Auth. Provider   ibuprofen (ADVIL) 800 MG tablet Take 1 tablet (800 mg total) by mouth every 8 (eight) hours as needed (pain). 21 tablet Dwyne Hasegawa, Gwenlyn Perking, MD      PDMP not reviewed this encounter.   Barrett Henle, MD 10/24/21 1318    Barrett Henle, MD 10/24/21 1318

## 2021-12-05 NOTE — Progress Notes (Unsigned)
Name: Susan [name]  Age/ Sex: 21 y.o., female   MRN/ DOB: [id number], 10/09/2000     PCP: Pcp, No   Reason for Endocrinology Evaluation: Type 1 Diabetes Mellitus  Initial Endocrine Consultative Visit: 06/09/2020    PATIENT IDENTIFIER: Susan [name] is a 21 y.o. female with a past medical history of T1DM. The patient has followed with Endocrinology clinic since 06/09/2020 for consultative assistance with management of her diabetes.  DIABETIC HISTORY:  [name] was diagnosed with DM in 2020. Was initially on insulin while in [place], per pt was taken off due to low A1c but restarted on insulin during hospitalization 05/2020. Insulin pump started 06/2020 . Her hemoglobin A1c has ranged from 10.1% in 2020, peaking at 10.3% in 2022.   On her initial visit she had an A1c of 10.3 % , we adjusted MDI regimen and was started on Omnipod in 06/2020     IDIOPATHIC HIRSUTISM: Testosterone, 17-OH progesterone, TFT's , DHEAS and 24-hr urinary cortisol have come back all normal 09/2020  SUBJECTIVE:   During the last visit (02/22/2021): A1c 5.8%   Today (12/05/2021): [name] is here for a follow up on diabetes management.  She checks her blood sugars multiple  times daily through CGM. The patient has  had hypoglycemic episodes since the last clinic visit, which typically occur 1 x / - most often occuring during the day. 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 During the visit the pump basal and bolus doses were reviewed including carb/insulin rations and supplemental doses. The clinical list was updated. The glucose meter download was reviewed in detail to determine if the current pump settings are providing the best glycemic control without excessive hypoglycemia.  Pump and meter download:    Pump   Omnipod Settings   Insulin type    Novolog   Basal rate       0000  0.65 u/h               I:C ratio       0000 1:15                  Sensitivity       0000  30       Goal       0000  100            Type & Model of Pump: Omnipod Insulin Type: Currently using Novolog .   PUMP STATISTICS: Average BG: 83 BG Readings: 0.6 / day Average Daily Carbs (g): 62.8 Average Total Daily Insulin: 17.6 Average Daily Basal: 14.2 (81 %) Average Daily Bolus: 3.4 (3.4 %)       HOME DIABETES REGIMEN:  Novolog   Statin: no ACE-I/ARB: no Prior Diabetic Education: yes    CONTINUOUS GLUCOSE MONITORING RECORD INTERPRETATION    Dates of Recording: 12/22-02/22/2021  Sensor description:dexcom  Results statistics:   CGM use % of time 71  Average and SD 107/28  Time in range   91 %  % Time Above 180 <1  % Time above 250 0  % Time Below target 6     Glycemic patterns summary: optimaly BG's through the  night and hypoglycemia noted during the day   Hyperglycemic episodes  Late at night   Hypoglycemic episodes occurred during the day   Overnight periods: stable  DIABETIC COMPLICATIONS: Microvascular complications:   Denies: CKD, neuropathy, retinopathy  Last Eye Exam: Completed 06/2020  Macrovascular complications:   Denies: CAD, CVA, PVD   HISTORY:  Past Medical History:  Past Medical History:  Diagnosis Date   Diabetes ([place])    Past Surgical History: No past surgical history on file. Social History:  reports that she has never smoked. She has never used smokeless tobacco. She reports that she does not drink alcohol and does not use drugs. Family History:  Family History  Problem Relation Age of Onset   Diabetes Mother    Diabetes Father      HOME MEDICATIONS: Allergies as of 12/06/2021   No Known Allergies      Medication List        Accurate as of December 05, 2021  9:52 AM. If you have any questions, ask your nurse or doctor.          cetirizine 10 MG  tablet Commonly known as: ZYRTEC Take 10 mg by mouth daily.   Dexcom G6 Sensor Misc 1 Device by Does not apply route as directed.   Dexcom G6 Transmitter Misc USE 1 DEVICE. DOES NOT APPLY ROUTE AS DIRECTED   ibuprofen 800 MG tablet Commonly known as: ADVIL Take 1 tablet (800 mg total) by mouth every 8 (eight) hours as needed (pain).   insulin aspart 100 UNIT/ML injection Commonly known as: NovoLOG INJECT UP TO 60 UNITS EVERY DAY   insulin lispro 100 UNIT/ML injection Commonly known as: HUMALOG Max daily 50 units   Insulin Pen Needle 30G X 5 MM Misc 1 Device by Does not apply route in the morning, at noon, in the evening, and at bedtime.   ketoconazole 2 % shampoo Commonly known as: NIZORAL Apply 1 application topically every 14 (fourteen) days.   magnesium oxide 400 (241.3 Mg) MG tablet Commonly known as: MAG-OX Take 1 tablet (400 mg total) by mouth daily.   methocarbamol 500 MG tablet Commonly known as: ROBAXIN Take 1 tablet (500 mg total) by mouth every 8 (eight) hours as needed for muscle spasms.   Omnipod 5 G6 Intro (Gen 5) Kit 1 Device by Does not apply route every 3 (three) days.   Omnipod 5 G6 Pod (Gen 5) Misc 1 Device by Does not apply route every 3 (three) days.         OBJECTIVE:   Vital Signs: There were no vitals taken for this visit.  Wt Readings from Last 3 Encounters:  02/22/21 219 lb (99.3 kg)  10/12/20 214 lb 12.8 oz (97.4 kg)  07/05/20 198 lb 4 oz (89.9 kg)     Exam: General: Pt appears well and is in NAD  Lungs: Clear with good BS bilat with no rales, rhonchi, or wheezes  Heart: RRR   Abdomen: Normoactive bowel sounds, soft, nontender, without masses or organomegaly palpable  Extremities: No pretibial edema.  Neuro: MS is good with appropriate affect, pt is alert and Ox3    DM foot exam: 02/22/2021     The skin of the feet is intact without sores or ulcerations. The pedal pulses are 2+ on right and 2+ on left. The sensation is  intact to a screening 5.07, 10 gram monofilament bilaterally    DATA REVIEWED:  Lab Results  Component Value Date   HGBA1C 5.8 (A) 02/22/2021   HGBA1C 5.4 10/12/2020   HGBA1C 9.6 (A) 07/05/2020   Results for ESMAE, DONATHAN (MRN [id number]) as of 10/13/2020 13:23  Ref. Range 10/12/2020  09:14  Sodium Latest Ref Range: 135 - 145 mEq/L 136  Potassium Latest Ref Range: 3.5 - 5.1 mEq/L 3.8  Chloride Latest Ref Range: 96 - 112 mEq/L 103  CO2 Latest Ref Range: 19 - 32 mEq/L 23  Glucose Latest Ref Range: 70 - 99 mg/dL 80  BUN Latest Ref Range: 6 - 23 mg/dL 12  Creatinine Latest Ref Range: 0.40 - 1.20 mg/dL 0.87  Calcium Latest Ref Range: 8.4 - 10.5 mg/dL 9.6  GFR Latest Ref Range: >60.00 mL/min 95.32  DHEA-SO4 Latest Ref Range: 44 - 286 mcg/dL 142  TSH Latest Ref Range: 0.35 - 5.50 uIU/mL 2.54  T4,Free(Direct) Latest Ref Range: 0.60 - 1.60 ng/dL 0.78    ASSESSMENT / PLAN / RECOMMENDATIONS:   1) Type 1 Diabetes Mellitus, with improving glycemic control, Without complications - Most recent A1c of 5.8%. Goal A1c < 7.0%.    -In review of her pump download the patient has been bolusing appropriately. She tends to have hypoglycemia during the daytime, will reduce basal rate as below  - She is interested in switching to Omnipod 5 which I have sent to [facility]    MEDICATIONS: Novolog      Pump   Omnipod Settings   Insulin type   Novolog   Basal rate       0000  0.65 u/h    10000 0.60           I:C ratio       0000 1:15                  Sensitivity       0000  30       Goal       0000  100         EDUCATION / INSTRUCTIONS: BG monitoring instructions: Patient is instructed to check her blood sugars 4 times a day, before meals and bedtime  Call [facility] if: BG persistently < 70  I reviewed the Rule of 15 for the treatment of hypoglycemia in detail with the patient. Literature supplied.   2) Diabetic complications:  Eye: Does not have known  diabetic retinopathy.  Neuro/ Feet: Does not have known diabetic peripheral neuropathy .  Renal: Patient does not have known baseline CKD. She   is not on an ACEI/ARB at present.    3) Lipids: No indication for statin therapy .     F/U in 6 months    Signed electronically by: Mack Guise, MD  [facility]  [facility] [address] [place],  [address] Phone: [phone] FAX: [phone]   CC: Pcp, No No address on file Phone: None  Fax: None  Return to Endocrinology clinic as below: Future Appointments  Date Time Provider [place]  12/06/2021  9:10 AM Jahzion Brogden, Melanie Crazier, MD [id number] None

## 2021-12-06 ENCOUNTER — Telehealth: Payer: BC Managed Care – PPO | Admitting: Internal Medicine

## 2021-12-06 ENCOUNTER — Encounter: Payer: Self-pay | Admitting: Internal Medicine

## 2021-12-06 VITALS — BP 104/68 | HR 97 | Ht 66.0 in | Wt 195.0 lb

## 2021-12-06 DIAGNOSIS — M79674 Pain in right toe(s): Secondary | ICD-10-CM

## 2021-12-06 DIAGNOSIS — E1065 Type 1 diabetes mellitus with hyperglycemia: Secondary | ICD-10-CM

## 2021-12-06 DIAGNOSIS — E781 Pure hyperglyceridemia: Secondary | ICD-10-CM | POA: Diagnosis not present

## 2021-12-06 LAB — POCT GLYCOSYLATED HEMOGLOBIN (HGB A1C): Hemoglobin A1C: 11.5 % — AB (ref 4.0–5.6)

## 2021-12-06 LAB — LIPID PANEL
Cholesterol: 195 mg/dL (ref 0–200)
HDL: 44.7 mg/dL (ref >39.00)
NonHDL: 150.75
Total CHOL/HDL Ratio: 4
Triglycerides: 273 mg/dL — ABNORMAL HIGH (ref 0.0–149.0)
VLDL: 54.6 mg/dL — ABNORMAL HIGH (ref 0.0–40.0)

## 2021-12-06 LAB — MICROALBUMIN / CREATININE URINE RATIO
Creatinine,U: 116.4 mg/dL
Microalb Creat Ratio: 3.6 mg/g (ref 0.0–30.0)
Microalb, Ur: 4.1 mg/dL — ABNORMAL HIGH (ref 0.0–1.9)

## 2021-12-06 LAB — BASIC METABOLIC PANEL
BUN: 12 mg/dL (ref 6–23)
CO2: 25 mEq/L (ref 19–32)
Calcium: 9.6 mg/dL (ref 8.4–10.5)
Chloride: 94 mEq/L — ABNORMAL LOW (ref 96–112)
Creatinine, Ser: 0.77 mg/dL (ref 0.40–1.20)
GFR: 109.48 mL/min (ref >60.00)
Glucose, Bld: 377 mg/dL — ABNORMAL HIGH (ref 70–99)
Potassium: 3.9 mEq/L (ref 3.5–5.1)
Sodium: 129 mEq/L — ABNORMAL LOW (ref 135–145)

## 2021-12-06 LAB — T4, FREE: Free T4: 0.95 ng/dL (ref 0.60–1.60)

## 2021-12-06 LAB — LDL CHOLESTEROL, DIRECT: Direct LDL: 105 mg/dL

## 2021-12-06 LAB — TSH: TSH: 3.91 u[IU]/mL (ref 0.35–5.50)

## 2021-12-06 MED ORDER — INSULIN LISPRO 100 UNIT/ML IJ SOLN: INTRAMUSCULAR | 3 refills | Status: DC

## 2021-12-06 NOTE — Patient Instructions (Signed)
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
## 2021-12-15 ENCOUNTER — Ambulatory Visit: Payer: BC Managed Care – PPO | Admitting: Podiatry

## 2021-12-15 ENCOUNTER — Encounter: Payer: Self-pay | Admitting: Podiatry

## 2021-12-15 DIAGNOSIS — M205X1 Other deformities of toe(s) (acquired), right foot: Secondary | ICD-10-CM

## 2021-12-15 NOTE — Patient Instructions (Signed)
Hammer Toe Hammer toe is a change in the shape, or a deformity, of the toe. The deformity causes the middle joint of the toe to stay bent. Hammer toe starts gradually. At first, the toe can be straightened. Then over time, the toe deformity becomes stiff, inflexible, and permanently bent. Hammer toe usually affects the second, third, or fourth toe. A hammer toe causes pain, especially when wearing shoes. Corns and calluses can result from the toe rubbing against the inside of the shoe. Early treatments to keep the toe straight may relieve pain. As the deformity of the toe becomes stiff and permanent, surgery may be needed to straighten the toe. What are the causes? This condition is caused by abnormal bending of the toe joint that is closest to your foot. Over time, the toe bending downward pulls on the muscles and connections (tendons) of the toe joint, making them weak and stiff. Wearing shoes that are too narrow in the toe box and do not allow toes to fully straighten can cause this condition. What increases the risk? You are more likely to develop this condition if you: Are an older female. Wear shoes that are too small, or wear high-heeled shoes that pinch your toes. Have a second toe that is longer than your big toe (first toe). Injure your foot or toe. Have arthritis, or have a nerve or muscle disorder. Have diabetes or a condition known as Charcot joint, which may cause you to walk abnormally. Have a family history of hammer toe. Are a ballet dancer. What are the signs or symptoms? Pain and deformity of the toe are the main symptoms of this condition. The pain is worse when wearing shoes, walking, or running. Other symptoms may include: A thickened patch of skin, called a corn or callus, that forms over the top of the bent part of the toe or between the toes. Redness and a burning feeling on the bent toe. An open sore that forms on the top of the bent toe. Not being able to straighten  the affected toe. How is this diagnosed? This condition is diagnosed based on your symptoms and a physical exam. During the exam, your health care provider will try to straighten your toe to see how stiff the deformity is. You may also have tests, such as: A blood test to check for rheumatoid arthritis or diabetes. An X-ray to show how severe the toe deformity is. How is this treated? Treatment for this condition depends on whether the toe is flexible or deformed and no longer moveable. In less severe cases, a hammer toe can be straightened without surgery. These treatments include: Taping the toe into a straightened position. Using pads and cushions to protect the bent toe. Wearing shoes that provide enough room for the toes. Doing toe-stretching exercises at home. Taking an NSAID, such as ibuprofen, to reduce pain and swelling. Using special orthotics or insoles for pain relief and to improve walking. If these treatments do not help or the toe has a severe deformity and cannot be straightened, surgery is the next option. The most common surgeries used to straighten a hammer toe include: Arthroplasty or osteotomy. Part of the toe joint is reconstructed or removed, which allows the toe to straighten. Fusion. Cartilage between the two bones of the joint is taken out, and the bones are fused together into one longer bone. Implantation. Part of the bone is removed and replaced with an implant to allow the toe to move again. Flexor tendon transfer.   The tendons that curl the toes down (flexor tendons) are repositioned. Follow these instructions at home: Take over-the-counter and prescription medicines only as told by your health care provider. Do toe-straightening and stretching exercises as told by your health care provider. Keep all follow-up visits. This is important. How is this prevented? Wear shoes that fit properly and give your toes enough room. Shoes should not cause pain. Buy shoes at  the end of the day to make sure they fit well, since your foot may swell during the day. Make sure they are comfortable before you buy them. As you age, your shoe size might change, including the width. Measure both feet and buy shoes for the larger foot. A shoe repair store might be able to stretch shoes that feel tight in spots. Do not wear high-heeled shoes or shoes with pointed toes. Contact a health care provider if: Your pain gets worse. Your toe becomes red or swollen. You develop an open sore on your toe. Summary Hammer toe is a condition that gradually causes your toe to become bent and stiff. Hammer toe can be treated by taping the toe into a straightened position and doing toe-stretching exercises. If these treatments do not help, surgery may be needed. To prevent this condition, wear shoes that fit properly, give your toes enough room, and do not cause pain. This information is not intended to replace advice given to you by your health care provider. Make sure you discuss any questions you have with your health care provider. Document Revised: 05/14/2019 Document Reviewed: 05/14/2019 Elsevier Patient Education  2023 [employer] Inc[employer]  

## 2021-12-15 NOTE — Progress Notes (Unsigned)
Subjective:   Patient ID: Susan [name], female   DOB: 21 y.o.   MRN: [id number]   HPI Chief Complaint  Patient presents with   Toe Pain    4th toe right - aching x 2-3 weeks, intermittent pain, no injury, endocrinologist checked-xrayed, no fractures, referred here   Diabetes    Last a1c was 11.0   New Patient (Initial Visit)    Only hurts with walking, but not all the time. Feels like  "fluidy type thing" when she walked. No injury/changes in activity.    ROS      Objective:  Physical Exam  ***     Assessment:  ***     Plan:  ***

## 2022-01-10 DIAGNOSIS — Z130 Encounter for screening for diseases of the blood and blood-forming organs and certain disorders involving the immune mechanism: Secondary | ICD-10-CM | POA: Diagnosis not present

## 2022-01-10 DIAGNOSIS — Z1389 Encounter for screening for other disorder: Secondary | ICD-10-CM | POA: Diagnosis not present

## 2022-01-10 DIAGNOSIS — Z01419 Encounter for gynecological examination (general) (routine) without abnormal findings: Secondary | ICD-10-CM | POA: Diagnosis not present

## 2022-01-10 DIAGNOSIS — L680 Hirsutism: Secondary | ICD-10-CM | POA: Diagnosis not present

## 2022-01-10 DIAGNOSIS — Z124 Encounter for screening for malignant neoplasm of cervix: Secondary | ICD-10-CM | POA: Diagnosis not present

## 2022-01-10 DIAGNOSIS — Z113 Encounter for screening for infections with a predominantly sexual mode of transmission: Secondary | ICD-10-CM | POA: Diagnosis not present

## 2022-01-30 DIAGNOSIS — E119 Type 2 diabetes mellitus without complications: Secondary | ICD-10-CM | POA: Diagnosis not present

## 2022-03-30 DIAGNOSIS — J019 Acute sinusitis, unspecified: Secondary | ICD-10-CM | POA: Diagnosis not present

## 2022-03-30 DIAGNOSIS — R0981 Nasal congestion: Secondary | ICD-10-CM | POA: Diagnosis not present

## 2022-03-30 DIAGNOSIS — J029 Acute pharyngitis, unspecified: Secondary | ICD-10-CM | POA: Diagnosis not present

## 2022-03-30 DIAGNOSIS — B9689 Other specified bacterial agents as the cause of diseases classified elsewhere: Secondary | ICD-10-CM | POA: Diagnosis not present

## 2022-03-30 DIAGNOSIS — R059 Cough, unspecified: Secondary | ICD-10-CM | POA: Diagnosis not present

## 2022-04-16 ENCOUNTER — Ambulatory Visit: Payer: BC Managed Care – PPO | Admitting: Internal Medicine

## 2022-04-16 NOTE — Progress Notes (Deleted)
Name: Susan [name]  Age/ Sex: 22 y.o., female   MRN/ DOB: [id number], 10-08-2000     PCP: Pcp, No   Reason for Endocrinology Evaluation: Type 1 Diabetes Mellitus  Initial Endocrine Consultative Visit: 06/09/2020    PATIENT IDENTIFIER: Susan [name] is a 22 y.o. female with a past medical history of T1DM. The patient has followed with Endocrinology clinic since 06/09/2020 for consultative assistance with management of her diabetes.  DIABETIC HISTORY:  Susan [name] was diagnosed with DM in 2020. Was initially on insulin while in [place], per pt was taken off due to low A1c but restarted on insulin during hospitalization 05/2020. Insulin pump started 06/2020 . Her hemoglobin A1c has ranged from 10.1% in 2020, peaking at 10.3% in 2022.   On her initial visit she had an A1c of 10.3 % , we adjusted MDI regimen and was started on Omnipod in 06/2020     IDIOPATHIC HIRSUTISM: Testosterone, 17-OH progesterone, TFT's , DHEAS and 24-hr urinary cortisol have come back all normal 09/2020  SUBJECTIVE:   During the last visit (12/06/2021): A1c 5.8%      Today (04/16/2022): Susan [name] is here for a follow up on diabetes management.  She checks her blood sugars multiple  times daily through CGM. The patient has  had hypoglycemic episodes since the last clinic visit, which typically occur 1 x / - most often occuring during the day. 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 During the visit the pump basal and bolus doses were reviewed including carb/insulin rations and supplemental doses. The clinical list was updated. The glucose meter download was reviewed  in detail to determine if the current pump settings are providing the best glycemic control without excessive hypoglycemia.  Pump and meter download:    Pump   Omnipod Settings   Insulin type   Novolog   Basal rate       0000  0.65 u/h    1000 0.65          I:C ratio       0000 1:12                  Sensitivity       0000  30       Goal       0000  100            Type & Model of Pump: Omnipod Insulin Type: Currently using Novolog .   PUMP STATISTICS: Average BG:275 Average Daily Carbs (g): 243.5 Average Total Daily Insulin: 48.7 Average Daily Basal: 25.5(52 %) Average Daily Bolus: 23.2 (48 %)    HOME DIABETES REGIMEN:  Humalog   Statin: no ACE-I/ARB: no Prior Diabetic Education: yes    CONTINUOUS GLUCOSE MONITORING RECORD INTERPRETATION    Dates of Recording: 10/5-10/18/2023  Sensor description:dexcom  Results statistics:   CGM use % of time 67.6  Average 275  Time in range  12%  % Time Above 180 25  % Time above 250 63  % Time Below target 0     Glycemic patterns summary: BG's have been  elevated during the day and night  Hyperglycemic episodes all day and night, but worse postprandial  Hypoglycemic episodes occurred n/a Overnight periods: High      DIABETIC COMPLICATIONS: Microvascular complications:   Denies: CKD, neuropathy, retinopathy  Last Eye Exam: Completed 06/2020  Macrovascular complications:   Denies: CAD, CVA, PVD   HISTORY:  Past Medical History:  Past Medical History:  Diagnosis Date   Diabetes ([facility])    Past Surgical History: No past surgical history on file. Social History:  reports that she has never smoked. She has never used smokeless tobacco. She reports that she does not drink alcohol and does not use drugs. Family History:  Family History  Problem Relation Age of Onset   Diabetes Mother    Diabetes Father      HOME MEDICATIONS: Allergies as of 04/16/2022   No Known Allergies       Medication List        Accurate as of April 16, 2022  7:31 AM. If you have any questions, ask your nurse or doctor.          cetirizine 10 MG tablet Commonly known as: ZYRTEC Take 10 mg by mouth daily.   Dexcom G6 Sensor Misc 1 Device by Does not apply route as directed.   Dexcom G6 Transmitter Misc USE 1 DEVICE. DOES NOT APPLY ROUTE AS DIRECTED   ibuprofen 800 MG tablet Commonly known as: ADVIL Take 1 tablet (800 mg total) by mouth every 8 (eight) hours as needed (pain).   insulin lispro 100 UNIT/ML injection Commonly known as: HUMALOG Max daily 60 units   Insulin Pen Needle 30G X 5 MM Misc 1 Device by Does not apply route in the morning, at noon, in the evening, and at bedtime.   ketoconazole 2 % shampoo Commonly known as: NIZORAL Apply 1 application topically every 14 (fourteen) days.   magnesium oxide 400 (241.3 Mg) MG tablet Commonly known as: MAG-OX Take 1 tablet (400 mg total) by mouth daily.   methocarbamol 500 MG tablet Commonly known as: ROBAXIN Take 1 tablet (500 mg total) by mouth every 8 (eight) hours as needed for muscle spasms.   Omnipod 5 G6 Intro (Gen 5) Kit 1 Device by Does not apply route every 3 (three) days.   Omnipod 5 G6 Pods (Gen 5) Misc 1 Device by Does not apply route every 3 (three) days.         OBJECTIVE:   Vital Signs: There were no vitals taken for this visit.  Wt Readings from Last 3 Encounters:  12/06/21 195 lb (88.5 kg)  02/22/21 219 lb (99.3 kg)  10/12/20 214 lb 12.8 oz (97.4 kg)     Exam: General: Pt appears well and is in NAD  Lungs: Clear with good BS bilat with no rales, rhonchi, or wheezes  Heart: RRR   Abdomen: Normoactive bowel sounds, soft, nontender, without masses or organomegaly palpable  Extremities: No pretibial edema.  Neuro: MS is good with appropriate affect, pt is alert and Ox3    DM foot exam: 12/06/2021   The skin of the feet is intact without sores or ulcerations. The pedal pulses  are 2+ on right and 2+ on left. The sensation is intact to a screening 5.07, 10 gram monofilament bilaterally    DATA REVIEWED:  Lab Results  Component Value Date   HGBA1C 11.5 (A) 12/06/2021   HGBA1C 5.8 (A) 02/22/2021   HGBA1C 5.4 10/12/2020     Latest Reference Range & Units  12/06/21 09:33  Sodium 135 - 145 mEq/L 129 (L)  Potassium 3.5 - 5.1 mEq/L 3.9  Chloride 96 - 112 mEq/L 94 (L)  CO2 19 - 32 mEq/L 25  Glucose 70 - 99 mg/dL 377 (H)  BUN 6 - 23 mg/dL 12  Creatinine 0.40 - 1.20 mg/dL 0.77  Calcium 8.4 - 10.5 mg/dL 9.6  GFR >60.00 mL/min 109.48    Latest Reference Range & Units 12/06/21 09:33  Total CHOL/HDL Ratio  4  Cholesterol 0 - 200 mg/dL 195  HDL Cholesterol >39.00 mg/dL 44.70  Direct LDL mg/dL 105.0  MICROALB/CREAT RATIO 0.0 - 30.0 mg/g 3.6  NonHDL  150.75  Triglycerides 0.0 - 149.0 mg/dL 273.0 (H)  VLDL 0.0 - 40.0 mg/dL 54.6 (H)    Latest Reference Range & Units 12/06/21 09:33  TSH 0.35 - 5.50 uIU/mL 3.91  T4,Free(Direct) 0.60 - 1.60 ng/dL 0.95    Latest Reference Range & Units 12/06/21 09:33  Creatinine,U mg/dL 116.4  Microalb, Ur 0.0 - 1.9 mg/dL 4.1 (H)  MICROALB/CREAT RATIO 0.0 - 30.0 mg/g 3.6      ASSESSMENT / PLAN / RECOMMENDATIONS:   1) Type 1 Diabetes Mellitus, with improving glycemic control, Without complications - Most recent A1c of 11.5%. Goal A1c < 7.0%.      - Her A1c has trended up from 5.8% to 11.5 % , she is not sure why or how ? - In reviewing her pump download, the pt noted with hyperglycemia as well as interruption of pump use, she has been out of insulin for 2 days because the pharmacy did not have it. I advised the pt of her HIGH risk of going to DKA without insulin coverage, and she should never be without insulin , patient can contact our office for samples if needed in the future and if the pharmacy is having shortage of supplies -I am going to adjust her basal rate during the day as well as insulin to carb ratio as below -My  assistant contacted the pharmacy today and the patient was offered partial pickup, they were not sure why the patient was not provided partial pickup to start with and was left to go without insulin for 2 days -BMP shows hyperglycemia, pseudohyponatremia but no DKA -MA/CR ratio normal  MEDICATIONS: Novolog      Pump   Omnipod Settings   Insulin type   Novolog   Basal rate       0000  0.65 u/h    10000 0.65          I:C ratio       0000 1:12                  Sensitivity       0000  30       Goal       0000  100         EDUCATION / INSTRUCTIONS: BG monitoring instructions: Patient is instructed to check her blood sugars 4 times a day, before meals and bedtime  Call [facility] if: BG persistently < 70  I reviewed the Rule of 15 for the treatment of hypoglycemia in detail with the patient. Literature supplied.   2) Diabetic complications:  Eye: Does not have known diabetic retinopathy.  Neuro/ Feet: Does not have known diabetic peripheral neuropathy .  Renal: Patient does not have known baseline CKD. She   is not on an ACEI/ARB at present.    3) Lipids: No indication for statin  therapy .   4) Right 4th Toe pain:  -Bilateral foot exam was normal today including external, dorsalis pedis pulses, and monofilament testing -I will refer her to podiatry for further evaluation of this pain as it is beyond my scope   5) Hypertriglyceridemia :  -Triglyceride elevated, patient will be counseled about low fat diet and low CHO diet   F/U in 4 months    Signed electronically by: Mack Guise, MD  [facility]  [facility] [address] [place], [place] [address] Phone: [phone] FAX: [phone]   CC: Pcp, No No address on file Phone: None  Fax: None  Return to Endocrinology clinic as below: Future Appointments  Date Time Provider [place]  04/16/2022 11:10 AM Henessy Rohrer, Melanie Crazier, MD [facility] None

## 2022-06-19 ENCOUNTER — Ambulatory Visit (INDEPENDENT_AMBULATORY_CARE_PROVIDER_SITE_OTHER): Payer: BC Managed Care – PPO | Admitting: Podiatry

## 2022-06-19 ENCOUNTER — Ambulatory Visit (INDEPENDENT_AMBULATORY_CARE_PROVIDER_SITE_OTHER): Payer: BC Managed Care – PPO

## 2022-06-19 DIAGNOSIS — M205X1 Other deformities of toe(s) (acquired), right foot: Secondary | ICD-10-CM

## 2022-06-19 DIAGNOSIS — E1065 Type 1 diabetes mellitus with hyperglycemia: Secondary | ICD-10-CM

## 2022-06-19 NOTE — Patient Instructions (Signed)
Hammer Toe Hammer toe is a change in the shape, or a deformity, of the toe. The deformity causes the middle joint of the toe to stay bent. Hammer toe starts gradually. At first, the toe can be straightened. Then over time, the toe deformity becomes stiff, inflexible, and permanently bent. Hammer toe usually affects the second, third, or fourth toe. A hammer toe causes pain, especially when wearing shoes. Corns and calluses can result from the toe rubbing against the inside of the shoe. Early treatments to keep the toe straight may relieve pain. As the deformity of the toe becomes stiff and permanent, surgery may be needed to straighten the toe. What are the causes? This condition is caused by abnormal bending of the toe joint that is closest to your foot. Over time, the toe bending downward pulls on the muscles and connections (tendons) of the toe joint, making them weak and stiff. Wearing shoes that are too narrow in the toe box and do not allow toes to fully straighten can cause this condition. What increases the risk? You are more likely to develop this condition if you: Are an older female. Wear shoes that are too small, or wear high-heeled shoes that pinch your toes. Have a second toe that is longer than your big toe (first toe). Injure your foot or toe. Have arthritis, or have a nerve or muscle disorder. Have diabetes or a condition known as Charcot joint, which may cause you to walk abnormally. Have a family history of hammer toe. Are a ballet dancer. What are the signs or symptoms? Pain and deformity of the toe are the main symptoms of this condition. The pain is worse when wearing shoes, walking, or running. Other symptoms may include: A thickened patch of skin, called a corn or callus, that forms over the top of the bent part of the toe or between the toes. Redness and a burning feeling on the bent toe. An open sore that forms on the top of the bent toe. Not being able to straighten  the affected toe. How is this diagnosed? This condition is diagnosed based on your symptoms and a physical exam. During the exam, your health care provider will try to straighten your toe to see how stiff the deformity is. You may also have tests, such as: A blood test to check for rheumatoid arthritis or diabetes. An X-ray to show how severe the toe deformity is. How is this treated? Treatment for this condition depends on whether the toe is flexible or deformed and no longer moveable. In less severe cases, a hammer toe can be straightened without surgery. These treatments include: Taping the toe into a straightened position. Using pads and cushions to protect the bent toe. Wearing shoes that provide enough room for the toes. Doing toe-stretching exercises at home. Taking an NSAID, such as ibuprofen, to reduce pain and swelling. Using special orthotics or insoles for pain relief and to improve walking. If these treatments do not help or the toe has a severe deformity and cannot be straightened, surgery is the next option. The most common surgeries used to straighten a hammer toe include: Arthroplasty or osteotomy. Part of the toe joint is reconstructed or removed, which allows the toe to straighten. Fusion. Cartilage between the two bones of the joint is taken out, and the bones are fused together into one longer bone. Implantation. Part of the bone is removed and replaced with an implant to allow the toe to move again. Flexor tendon transfer.   The tendons that curl the toes down (flexor tendons) are repositioned. Follow these instructions at home: Take over-the-counter and prescription medicines only as told by your health care provider. Do toe-straightening and stretching exercises as told by your health care provider. Keep all follow-up visits. This is important. How is this prevented? Wear shoes that fit properly and give your toes enough room. Shoes should not cause pain. Buy shoes at  the end of the day to make sure they fit well, since your foot may swell during the day. Make sure they are comfortable before you buy them. As you age, your shoe size might change, including the width. Measure both feet and buy shoes for the larger foot. A shoe repair store might be able to stretch shoes that feel tight in spots. Do not wear high-heeled shoes or shoes with pointed toes. Contact a health care provider if: Your pain gets worse. Your toe becomes red or swollen. You develop an open sore on your toe. Summary Hammer toe is a condition that gradually causes your toe to become bent and stiff. Hammer toe can be treated by taping the toe into a straightened position and doing toe-stretching exercises. If these treatments do not help, surgery may be needed. To prevent this condition, wear shoes that fit properly, give your toes enough room, and do not cause pain. This information is not intended to replace advice given to you by your health care provider. Make sure you discuss any questions you have with your health care provider. Document Revised: 05/14/2019 Document Reviewed: 05/14/2019 Elsevier Patient Education  2023 [employer] Inc[employer]  

## 2022-06-19 NOTE — Progress Notes (Signed)
Subjective: Chief Complaint  Patient presents with   Toe Pain    Rm 13 Right 4th toe pain ans stiffness with new curvature. Pt states pain back 3 months ago. Pt states she tried using th wrap that was given and it did not help with her pain.    22 year old female presents the office today with concerns of reoccurrence of right fourth toe pain, stiffness.  The curvature has been getting worse as well.  She has been using about the toes which has not been helping.  She has not had her A1c rechecked.   Objective: AAO x3, NAD DP/PT pulses palpable bilaterally, CRT less than 3 seconds On the right foot there is tenderness palpation on lateral of the fourth toe on the PIPJ and this appears to be the fifth toes also turned inward putting pressure on the fourth toe.  There is no callus or skin breakdown noted today.  There is no significant edema is no erythema or any warmth. 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  -X-ray were obtained and reviewed.  3 views of the foot were obtained.  That appears is present at the fourth and fifth toes. -We discussed both conservative as well as surgical treatment options.  Unfortunately with her increased A1c will need to recheck a A1c prior to a month of surgery.  This was ordered for today.  I dispensed toe separators and toe.if symptoms continue consider surgical intervention to straighten the fourth and fifth toes once the A1c has improved.  Vivi Barrack DPM

## 2022-06-20 LAB — HEMOGLOBIN A1C
Est. average glucose Bld gHb Est-mCnc: 146 mg/dL
Hgb A1c MFr Bld: 6.7 % — ABNORMAL HIGH (ref 4.8–5.6)

## 2022-06-20 NOTE — Progress Notes (Signed)
Noted, than you. This looks great

## 2022-06-23 ENCOUNTER — Encounter: Payer: Self-pay | Admitting: Podiatry

## 2022-06-24 NOTE — Telephone Encounter (Signed)
Susan [name], can you please schedule her a surgery consult? Thanks!

## 2022-06-29 ENCOUNTER — Ambulatory Visit: Payer: BC Managed Care – PPO | Admitting: Podiatry

## 2022-07-10 ENCOUNTER — Ambulatory Visit (INDEPENDENT_AMBULATORY_CARE_PROVIDER_SITE_OTHER): Payer: BC Managed Care – PPO | Admitting: Podiatry

## 2022-07-10 DIAGNOSIS — M2041 Other hammer toe(s) (acquired), right foot: Secondary | ICD-10-CM | POA: Diagnosis not present

## 2022-07-10 NOTE — Progress Notes (Signed)
Subjective: Chief Complaint  Patient presents with   Foot Pain    Right foot patient here to sign consent forms and get scheduled for surgery.   22 year old female presents for above concerns.  She said that overall she is still having symptoms and pain at the right fourth and fifth toes.  However A1c is improved she will to proceed with surgical intervention.  She is attempted conservative treatments including she modifications, padding, offloading significant improvement.  No h/o DVT Negative tobacco use Last A1c: 6.7 on 06/19/2022  Objective: AAO x3, NAD DP/PT pulses palpable bilaterally, CRT less than 3 seconds Digital swelling noted at the fourth and fifth toes of the right foot with tenderness palpation of the digits.  There is no callus formation, open lesions but the toes are rubbing. No pain with calf compression, swelling, warmth, erythema  Assessment: Chronic right fourth, fifth toe pain  Plan: -All treatment options discussed with the patient including all alternatives, risks, complications.  -Discussed the conservative as well as surgical treatment options.  This time she is attempted conservative treatments and she was proceed with surgical intervention.  Discussed with her arthroplasty of the fourth and fifth toes possible K wire fixation.  She would like to proceed with this. -The incision placement as well as the postoperative course was discussed with the patient. I discussed risks of the surgery which include, but not limited to, infection, bleeding, pain, swelling, need for further surgery, delayed or nonhealing, painful or ugly scar, numbness or sensation changes, over/under correction, recurrence, transfer lesions, further deformity, hardware failure, DVT/PE, loss of toe/foot. Patient understands these risks and wishes to proceed with surgery. The surgical consent was reviewed with the patient all 3 pages were signed. No promises or guarantees were given to the outcome of  the procedure. All questions were answered to the best of my ability. Before the surgery the patient was encouraged to call the office if there is any further questions. The surgery will be performed at the [facility] on an outpatient basis. -Patient encouraged to call the office with any questions, concerns, change in symptoms.   Vivi Barrack DPM

## 2022-07-10 NOTE — Patient Instructions (Signed)
Pre-Operative Instructions  Congratulations, you have decided to take an important step to improving your quality of life.  You can be assured that the doctors of [facility] will be with you every step of the way.  Plan to be at the surgery center/hospital at least 1 (one) hour prior to your scheduled time unless otherwise directed by the surgical center/hospital staff.  You must have a responsible adult accompany you, remain during the surgery and drive you home.  Make sure you have directions to the surgical center/hospital and know how to get there on time. For hospital based surgery you will need to obtain a history and physical form from your family physician within 1 month prior to the date of surgery- we will give you a form for you primary physician.  We make every effort to accommodate the date you request for surgery.  There are however, times where surgery dates or times have to be moved.  We will contact you as soon as possible if a change in schedule is required.   No Aspirin/Ibuprofen for one week before surgery.  If you are on aspirin, any non-steroidal anti-inflammatory medications (Mobic, Aleve, Ibuprofen) you should stop taking it 7 days prior to your surgery.  You make take Tylenol  For pain prior to surgery.  Medications- If you are taking daily heart and blood pressure medications, seizure, reflux, allergy, asthma, anxiety, pain or diabetes medications, make sure the surgery center/hospital is aware before the day of surgery so they may notify you which medications to take or avoid the day of surgery. No food or drink after midnight the night before surgery unless directed otherwise by surgical center/hospital staff. No alcoholic beverages 24 hours prior to surgery.  No smoking 24 hours prior to or 24 hours after surgery. Wear loose pants or shorts- loose enough to fit over bandages, boots, and casts. No slip on shoes, sneakers are best. Bring your boot with you to the  surgery center/hospital.  Also bring crutches or a walker if your physician has prescribed it for you.  If you do not have this equipment, it will be provided for you after surgery. If you have not been contracted by the surgery center/hospital by the day before your surgery, call to confirm the date and time of your surgery. Leave-time from work may vary depending on the type of surgery you have.  Appropriate arrangements should be made prior to surgery with your employer. Prescriptions will be provided immediately following surgery by your doctor.  Have these filled as soon as possible after surgery and take the medication as directed. Remove nail polish on the operative foot. Wash the night before surgery.  The night before surgery wash the foot and leg well with the antibacterial soap provided and water paying special attention to beneath the toenails and in between the toes.  Rinse thoroughly with water and dry well with a towel.  Perform this wash unless told not to do so by your physician.  Enclosed: 1 Ice pack (please put in freezer the night before surgery)   1 Hibiclens skin cleaner   Pre-op Instructions  If you have any questions regarding the instructions, do not hesitate to call our office at any point during this process.   [place]: [address] [place], [place] [address] [phone]  [place]: [address] [place], [place] [address] [phone]  Dr. Zamzam Whinery, DPM  

## 2022-07-17 ENCOUNTER — Telehealth: Payer: Self-pay | Admitting: Urology

## 2022-07-17 NOTE — Telephone Encounter (Signed)
DOS - 08/08/22  HAMMERTOE REPAIR 4,5 RIGHT --- [address]  [employer] EFFECTIVE DATE - 02/19/22  DEDUCTIBLE - $500.00 W/ $483.30 REMAINING OOP - $4,000.00 W/ $3,449.14  REMAINING COINSURANCE - 30%    PER [employer] WEBSITE FOR CPT CODE 60454 HAS BEEN APPROVED, AUTH # [id number], GOOD FROM 08/08/22 - 10/06/22.

## 2022-07-28 DIAGNOSIS — R0982 Postnasal drip: Secondary | ICD-10-CM | POA: Diagnosis not present

## 2022-07-28 DIAGNOSIS — R070 Pain in throat: Secondary | ICD-10-CM | POA: Diagnosis not present

## 2022-07-28 DIAGNOSIS — R051 Acute cough: Secondary | ICD-10-CM | POA: Diagnosis not present

## 2022-07-28 DIAGNOSIS — J038 Acute tonsillitis due to other specified organisms: Secondary | ICD-10-CM | POA: Diagnosis not present

## 2022-07-28 DIAGNOSIS — R110 Nausea: Secondary | ICD-10-CM | POA: Diagnosis not present

## 2022-07-30 ENCOUNTER — Emergency Department (HOSPITAL_COMMUNITY): Payer: BC Managed Care – PPO

## 2022-07-30 ENCOUNTER — Inpatient Hospital Stay (HOSPITAL_COMMUNITY)
Admission: EM | Admit: 2022-07-30 | Discharge: 2022-07-31 | DRG: 639 | Disposition: A | Discharge status: Home / Self Care | Payer: BC Managed Care – PPO | Attending: Internal Medicine | Admitting: Internal Medicine

## 2022-07-30 ENCOUNTER — Other Ambulatory Visit: Payer: Self-pay

## 2022-07-30 ENCOUNTER — Encounter (HOSPITAL_COMMUNITY): Payer: Self-pay

## 2022-07-30 DIAGNOSIS — Z833 Family history of diabetes mellitus: Secondary | ICD-10-CM | POA: Diagnosis not present

## 2022-07-30 DIAGNOSIS — T360X5A Adverse effect of penicillins, initial encounter: Secondary | ICD-10-CM | POA: Diagnosis present

## 2022-07-30 DIAGNOSIS — R21 Rash and other nonspecific skin eruption: Secondary | ICD-10-CM | POA: Diagnosis not present

## 2022-07-30 DIAGNOSIS — Z7952 Long term (current) use of systemic steroids: Secondary | ICD-10-CM | POA: Diagnosis not present

## 2022-07-30 DIAGNOSIS — E871 Hypo-osmolality and hyponatremia: Secondary | ICD-10-CM | POA: Diagnosis not present

## 2022-07-30 DIAGNOSIS — J029 Acute pharyngitis, unspecified: Secondary | ICD-10-CM

## 2022-07-30 DIAGNOSIS — Z9641 Presence of insulin pump (external) (internal): Secondary | ICD-10-CM | POA: Diagnosis present

## 2022-07-30 DIAGNOSIS — E876 Hypokalemia: Secondary | ICD-10-CM | POA: Diagnosis present

## 2022-07-30 DIAGNOSIS — E1110 Type 2 diabetes mellitus with ketoacidosis without coma: Secondary | ICD-10-CM | POA: Diagnosis not present

## 2022-07-30 DIAGNOSIS — R000 Tachycardia, unspecified: Secondary | ICD-10-CM | POA: Diagnosis not present

## 2022-07-30 DIAGNOSIS — R079 Chest pain, unspecified: Secondary | ICD-10-CM | POA: Diagnosis not present

## 2022-07-30 DIAGNOSIS — Z794 Long term (current) use of insulin: Secondary | ICD-10-CM

## 2022-07-30 DIAGNOSIS — E1010 Type 1 diabetes mellitus with ketoacidosis without coma: Secondary | ICD-10-CM | POA: Diagnosis not present

## 2022-07-30 DIAGNOSIS — E1065 Type 1 diabetes mellitus with hyperglycemia: Secondary | ICD-10-CM

## 2022-07-30 DIAGNOSIS — R112 Nausea with vomiting, unspecified: Secondary | ICD-10-CM | POA: Diagnosis not present

## 2022-07-30 LAB — BASIC METABOLIC PANEL
Anion gap: 17 — ABNORMAL HIGH (ref 5–15)
Anion gap: 10 (ref 5–15)
Anion gap: 9 (ref 5–15)
Anion gap: 7 (ref 5–15)
BUN: 8 mg/dL (ref 6–20)
BUN: 7 mg/dL (ref 6–20)
BUN: 8 mg/dL (ref 6–20)
BUN: 8 mg/dL (ref 6–20)
CO2: 8 mmol/L — ABNORMAL LOW (ref 22–32)
CO2: 14 mmol/L — ABNORMAL LOW (ref 22–32)
CO2: 17 mmol/L — ABNORMAL LOW (ref 22–32)
CO2: 17 mmol/L — ABNORMAL LOW (ref 22–32)
Calcium: 8.3 mg/dL — ABNORMAL LOW (ref 8.9–10.3)
Calcium: 8.3 mg/dL — ABNORMAL LOW (ref 8.9–10.3)
Calcium: 9.1 mg/dL (ref 8.9–10.3)
Calcium: 8.7 mg/dL — ABNORMAL LOW (ref 8.9–10.3)
Chloride: 108 mmol/L (ref 98–111)
Chloride: 107 mmol/L (ref 98–111)
Chloride: 108 mmol/L (ref 98–111)
Chloride: 109 mmol/L (ref 98–111)
Creatinine, Ser: 0.77 mg/dL (ref 0.44–1.00)
Creatinine, Ser: 0.79 mg/dL (ref 0.44–1.00)
Creatinine, Ser: 0.63 mg/dL (ref 0.44–1.00)
Creatinine, Ser: 0.7 mg/dL (ref 0.44–1.00)
GFR, Estimated: >60 mL/min (ref >60)
GFR, Estimated: >60 mL/min (ref >60)
GFR, Estimated: >60 mL/min (ref >60)
GFR, Estimated: >60 mL/min (ref >60)
Glucose, Bld: 186 mg/dL — ABNORMAL HIGH (ref 70–99)
Glucose, Bld: 196 mg/dL — ABNORMAL HIGH (ref 70–99)
Glucose, Bld: 139 mg/dL — ABNORMAL HIGH (ref 70–99)
Glucose, Bld: 155 mg/dL — ABNORMAL HIGH (ref 70–99)
Potassium: 3.4 mmol/L — ABNORMAL LOW (ref 3.5–5.1)
Potassium: 3.8 mmol/L (ref 3.5–5.1)
Potassium: 2.9 mmol/L — ABNORMAL LOW (ref 3.5–5.1)
Potassium: 2.9 mmol/L — ABNORMAL LOW (ref 3.5–5.1)
Sodium: 133 mmol/L — ABNORMAL LOW (ref 135–145)
Sodium: 131 mmol/L — ABNORMAL LOW (ref 135–145)
Sodium: 134 mmol/L — ABNORMAL LOW (ref 135–145)
Sodium: 133 mmol/L — ABNORMAL LOW (ref 135–145)

## 2022-07-30 LAB — HIV ANTIBODY (ROUTINE TESTING W REFLEX): HIV Screen 4th Generation wRfx: NONREACTIVE

## 2022-07-30 LAB — RESPIRATORY PANEL BY PCR

## 2022-07-30 LAB — COMPREHENSIVE METABOLIC PANEL
ALT: 14 U/L (ref 0–44)
AST: 17 U/L (ref 15–41)
Albumin: 4.9 g/dL (ref 3.5–5.0)
Alkaline Phosphatase: 101 U/L (ref 38–126)
Anion gap: 23 — ABNORMAL HIGH (ref 5–15)
BUN: 11 mg/dL (ref 6–20)
CO2: 7 mmol/L — ABNORMAL LOW (ref 22–32)
Calcium: 8.9 mg/dL (ref 8.9–10.3)
Chloride: 98 mmol/L (ref 98–111)
Creatinine, Ser: 0.98 mg/dL (ref 0.44–1.00)
GFR, Estimated: >60 mL/min (ref >60)
Glucose, Bld: 386 mg/dL — ABNORMAL HIGH (ref 70–99)
Potassium: 4.2 mmol/L (ref 3.5–5.1)
Sodium: 128 mmol/L — ABNORMAL LOW (ref 135–145)
Total Bilirubin: 1.2 mg/dL (ref 0.3–1.2)
Total Protein: 9.8 g/dL — ABNORMAL HIGH (ref 6.5–8.1)

## 2022-07-30 LAB — CBC
HCT: 46.4 % — ABNORMAL HIGH (ref 36.0–46.0)
HCT: 39.7 % (ref 36.0–46.0)
Hemoglobin: 15.4 g/dL — ABNORMAL HIGH (ref 12.0–15.0)
Hemoglobin: 13 g/dL (ref 12.0–15.0)
MCH: 29.8 pg (ref 26.0–34.0)
MCH: 29.7 pg (ref 26.0–34.0)
MCHC: 33.2 g/dL (ref 30.0–36.0)
MCHC: 32.7 g/dL (ref 30.0–36.0)
MCV: 89.9 fL (ref 80.0–100.0)
MCV: 90.6 fL (ref 80.0–100.0)
Platelets: 313 10*3/uL (ref 150–400)
Platelets: 274 10*3/uL (ref 150–400)
RBC: 5.16 MIL/uL — ABNORMAL HIGH (ref 3.87–5.11)
RBC: 4.38 MIL/uL (ref 3.87–5.11)
RDW: 12.7 % (ref 11.5–15.5)
RDW: 12.7 % (ref 11.5–15.5)
WBC: 8.8 10*3/uL (ref 4.0–10.5)
WBC: 10.1 10*3/uL (ref 4.0–10.5)
nRBC: 0 % (ref 0.0–0.2)
nRBC: 0 % (ref 0.0–0.2)

## 2022-07-30 LAB — C-REACTIVE PROTEIN: CRP: <0.5 mg/dL (ref <1.0)

## 2022-07-30 LAB — BLOOD GAS, VENOUS
Acid-base deficit: 21.3 mmol/L — ABNORMAL HIGH (ref 0.0–2.0)
Acid-base deficit: 20.3 mmol/L — ABNORMAL HIGH (ref 0.0–2.0)
Bicarbonate: 7.5 mmol/L — ABNORMAL LOW (ref 20.0–28.0)
Bicarbonate: 8.2 mmol/L — ABNORMAL LOW (ref 20.0–28.0)
O2 Saturation: 53.1 %
O2 Saturation: 53.5 %
Patient temperature: 36.1
Patient temperature: 37
pCO2, Ven: 25 mmHg — ABNORMAL LOW (ref 44–60)
pCO2, Ven: 27 mmHg — ABNORMAL LOW (ref 44–60)
pH, Ven: 7.08 — CL (ref 7.25–7.43)
pH, Ven: 7.09 — CL (ref 7.25–7.43)
pO2, Ven: <31 mmHg — CL (ref 32–45)
pO2, Ven: <31 mmHg — CL (ref 32–45)

## 2022-07-30 LAB — URINALYSIS, ROUTINE W REFLEX MICROSCOPIC
Bacteria, UA: NONE SEEN
Bilirubin Urine: NEGATIVE
Glucose, UA: >=500 mg/dL — AB
Hgb urine dipstick: NEGATIVE
Ketones, ur: 80 mg/dL — AB
Leukocytes,Ua: NEGATIVE
Nitrite: NEGATIVE
Protein, ur: 100 mg/dL — AB
Specific Gravity, Urine: 1.027 (ref 1.005–1.030)
pH: 5 (ref 5.0–8.0)

## 2022-07-30 LAB — CBG MONITORING, ED
Glucose-Capillary: 406 mg/dL — ABNORMAL HIGH (ref 70–99)
Glucose-Capillary: 406 mg/dL — ABNORMAL HIGH (ref 70–99)
Glucose-Capillary: 387 mg/dL — ABNORMAL HIGH (ref 70–99)
Glucose-Capillary: 314 mg/dL — ABNORMAL HIGH (ref 70–99)
Glucose-Capillary: 228 mg/dL — ABNORMAL HIGH (ref 70–99)
Glucose-Capillary: 189 mg/dL — ABNORMAL HIGH (ref 70–99)
Glucose-Capillary: 175 mg/dL — ABNORMAL HIGH (ref 70–99)
Glucose-Capillary: 194 mg/dL — ABNORMAL HIGH (ref 70–99)
Glucose-Capillary: 176 mg/dL — ABNORMAL HIGH (ref 70–99)
Glucose-Capillary: 191 mg/dL — ABNORMAL HIGH (ref 70–99)

## 2022-07-30 LAB — GLUCOSE, CAPILLARY
Glucose-Capillary: 153 mg/dL — ABNORMAL HIGH (ref 70–99)
Glucose-Capillary: 158 mg/dL — ABNORMAL HIGH (ref 70–99)
Glucose-Capillary: 160 mg/dL — ABNORMAL HIGH (ref 70–99)
Glucose-Capillary: 198 mg/dL — ABNORMAL HIGH (ref 70–99)
Glucose-Capillary: 200 mg/dL — ABNORMAL HIGH (ref 70–99)
Glucose-Capillary: 202 mg/dL — ABNORMAL HIGH (ref 70–99)
Glucose-Capillary: 353 mg/dL — ABNORMAL HIGH (ref 70–99)

## 2022-07-30 LAB — MRSA NEXT GEN BY PCR, NASAL: MRSA by PCR Next Gen: NOT DETECTED

## 2022-07-30 LAB — LIPASE, BLOOD: Lipase: 29 U/L (ref 11–51)

## 2022-07-30 LAB — I-STAT BETA HCG BLOOD, ED (MC, WL, AP ONLY): I-stat hCG, quantitative: <5 m[IU]/mL (ref <5)

## 2022-07-30 LAB — TSH: TSH: 1.91 u[IU]/mL (ref 0.350–4.500)

## 2022-07-30 LAB — BETA-HYDROXYBUTYRIC ACID
Beta-Hydroxybutyric Acid: >8 mmol/L — ABNORMAL HIGH (ref 0.05–0.27)
Beta-Hydroxybutyric Acid: 3.36 mmol/L — ABNORMAL HIGH (ref 0.05–0.27)
Beta-Hydroxybutyric Acid: 1.57 mmol/L — ABNORMAL HIGH (ref 0.05–0.27)

## 2022-07-30 LAB — HEMOGLOBIN A1C
Hgb A1c MFr Bld: 11.9 % — ABNORMAL HIGH (ref 4.8–5.6)
Mean Plasma Glucose: 294.83 mg/dL

## 2022-07-30 LAB — GROUP A STREP BY PCR: Group A Strep by PCR: NOT DETECTED

## 2022-07-30 LAB — SEDIMENTATION RATE: Sed Rate: 15 mm/hr (ref 0–22)

## 2022-07-30 MED ORDER — POTASSIUM CHLORIDE 10 MEQ/100ML IV SOLN
10.0000 meq | INTRAVENOUS | Status: AC
  Administered 2022-07-30 – 2022-07-31 (×3): 10 meq via INTRAVENOUS
  Filled 2022-07-30 (×3): qty 100

## 2022-07-30 MED ORDER — DIPHENHYDRAMINE HCL 50 MG/ML IJ SOLN
25.0000 mg | Freq: Once | INTRAMUSCULAR | Status: AC
  Administered 2022-07-30: 25 mg via INTRAVENOUS
  Filled 2022-07-30: qty 1

## 2022-07-30 MED ORDER — DEXTROSE IN LACTATED RINGERS 5 % IV SOLN: INTRAVENOUS | Status: DC

## 2022-07-30 MED ORDER — LACTATED RINGERS IV SOLN: INTRAVENOUS | Status: DC

## 2022-07-30 MED ORDER — ACETAMINOPHEN 325 MG PO TABS
650.0000 mg | ORAL_TABLET | Freq: Four times a day (QID) | ORAL | Status: DC | PRN
  Administered 2022-07-30: 650 mg via ORAL
  Filled 2022-07-30: qty 2

## 2022-07-30 MED ORDER — FAMOTIDINE IN NACL 20-0.9 MG/50ML-% IV SOLN
20.0000 mg | Freq: Once | INTRAVENOUS | Status: AC
  Administered 2022-07-30: 20 mg via INTRAVENOUS
  Filled 2022-07-30: qty 50

## 2022-07-30 MED ORDER — LEVALBUTEROL HCL 0.63 MG/3ML IN NEBU: 0.6300 mg | INHALATION_SOLUTION | Freq: Four times a day (QID) | RESPIRATORY_TRACT | Status: DC | PRN

## 2022-07-30 MED ORDER — ACETAMINOPHEN 650 MG RE SUPP: 650.0000 mg | Freq: Four times a day (QID) | RECTAL | Status: DC | PRN

## 2022-07-30 MED ORDER — LACTATED RINGERS IV BOLUS
20.0000 mL/kg | Freq: Once | INTRAVENOUS | Status: AC
  Administered 2022-07-30: 1796 mL via INTRAVENOUS

## 2022-07-30 MED ORDER — SODIUM CHLORIDE 0.9 % IV BOLUS
1000.0000 mL | Freq: Once | INTRAVENOUS | Status: AC
  Administered 2022-07-30: 1000 mL via INTRAVENOUS

## 2022-07-30 MED ORDER — INSULIN GLARGINE-YFGN 100 UNIT/ML ~~LOC~~ SOLN
10.0000 [IU] | Freq: Once | SUBCUTANEOUS | Status: AC
  Administered 2022-07-30: 10 [IU] via SUBCUTANEOUS
  Filled 2022-07-30: qty 0.1

## 2022-07-30 MED ORDER — POTASSIUM CHLORIDE CRYS ER 20 MEQ PO TBCR
40.0000 meq | EXTENDED_RELEASE_TABLET | Freq: Two times a day (BID) | ORAL | Status: AC
  Administered 2022-07-30 (×2): 40 meq via ORAL
  Filled 2022-07-30 (×2): qty 2

## 2022-07-30 MED ORDER — POTASSIUM CHLORIDE 10 MEQ/100ML IV SOLN
10.0000 meq | INTRAVENOUS | Status: AC
  Administered 2022-07-30 (×2): 10 meq via INTRAVENOUS
  Filled 2022-07-30 (×2): qty 100

## 2022-07-30 MED ORDER — CHLORHEXIDINE GLUCONATE CLOTH 2 % EX PADS
6.0000 | MEDICATED_PAD | Freq: Every day | CUTANEOUS | Status: DC
  Administered 2022-07-30: 6 via TOPICAL

## 2022-07-30 MED ORDER — DEXTROSE 50 % IV SOLN: 0.0000 mL | INTRAVENOUS | Status: DC | PRN

## 2022-07-30 MED ORDER — ONDANSETRON HCL 4 MG PO TABS: 4.0000 mg | ORAL_TABLET | Freq: Four times a day (QID) | ORAL | Status: DC | PRN

## 2022-07-30 MED ORDER — ACETAMINOPHEN 500 MG PO TABS
1000.0000 mg | ORAL_TABLET | Freq: Once | ORAL | Status: AC
  Administered 2022-07-30: 1000 mg via ORAL
  Filled 2022-07-30: qty 2

## 2022-07-30 MED ORDER — INSULIN REGULAR(HUMAN) IN NACL 100-0.9 UT/100ML-% IV SOLN
INTRAVENOUS | Status: DC
  Administered 2022-07-30: 16 [IU]/h via INTRAVENOUS
  Filled 2022-07-30 (×2): qty 100

## 2022-07-30 MED ORDER — HEPARIN SODIUM (PORCINE) 5000 UNIT/ML IJ SOLN
5000.0000 [IU] | Freq: Three times a day (TID) | INTRAMUSCULAR | Status: DC
  Administered 2022-07-30 – 2022-07-31 (×2): 5000 [IU] via SUBCUTANEOUS
  Filled 2022-07-30 (×2): qty 1

## 2022-07-30 MED ORDER — INSULIN ASPART 100 UNIT/ML IJ SOLN
0.0000 [IU] | INTRAMUSCULAR | Status: DC
  Administered 2022-07-30: 9 [IU] via SUBCUTANEOUS
  Administered 2022-07-31: 3 [IU] via SUBCUTANEOUS

## 2022-07-30 MED ORDER — ONDANSETRON HCL 4 MG/2ML IJ SOLN: 4.0000 mg | Freq: Four times a day (QID) | INTRAMUSCULAR | Status: DC | PRN

## 2022-07-30 MED ORDER — ORAL CARE MOUTH RINSE: 15.0000 mL | OROMUCOSAL | Status: DC | PRN

## 2022-07-30 MED ORDER — ONDANSETRON HCL 4 MG/2ML IJ SOLN
4.0000 mg | Freq: Once | INTRAMUSCULAR | Status: AC
  Administered 2022-07-30: 4 mg via INTRAVENOUS
  Filled 2022-07-30: qty 2

## 2022-07-30 NOTE — Inpatient Diabetes Management (Signed)
Inpatient Diabetes Program Recommendations  AACE/ADA: New Consensus Statement on Inpatient Glycemic Control (2015)  Target Ranges:  Prepandial:   less than 140 mg/dL      Peak postprandial:   less than 180 mg/dL (1-2 hours)      Critically ill patients:  140 - 180 mg/dL   Lab Results  Component Value Date   GLUCAP 202 (H) 07/30/2022   HGBA1C 11.9 (H) 07/30/2022    Review of Glycemic Control  Diabetes history: DM1 Outpatient Diabetes medications: OmniPod with Humalog (not in past 5 months) Dexcom CGM Current orders for Inpatient glycemic control: IV insulin per [facility] for DKA  HgbA1C - 11.9%  Pump settings: Basal: 12 MN 0.65 1000 0.6 Bolus: CF 30       CHO ratio 1:15 Goal is 100 mg/dL   Inpatient Diabetes Program Recommendations:    Spoke with pt at bedside regarding her diabetes and control over past few months. Pt states she missed endo appt with Dr Lonzo Cloud back in January. Said she was consistently having lows therefore decided to take pump off and control blood sugars with diet and exercise. States her blood sugars were pretty good until she started on steroids. Admitted with blood sugar of 406. Discussed pt being a Type 1 and needs insulin to live. Pt said the insulin was dropping her too low and stopped taking because rate was too high. Did not make f/u appt with Endo. Last HgbA1C was 11.9% on 6/10, previously 6.7% on 4/30 24. Stressed importance of f/u with Endo.  Continue on IV insulin until criteria met for discontinuation.   Will f/u in am.   Thank you. Ailene Ards, RD, LDN, CDCES Inpatient Diabetes Coordinator [phone]

## 2022-07-30 NOTE — H&P (Addendum)
History and Physical    Susan [name] ZOX:[id number] DOB: 07/31/2000 DOA: 07/30/2022  PCP: Pcp, No  Patient coming from: home  I have personally briefly reviewed patient's old medical records in [facility]  Chief Complaint: n/v/abdominal pain  x 1 day   HPI: Susan [name] is a 22 y.o. female with medical history significant of  DM type I not currently on insulin with last A1c in >7 followed by endocrine Dr Lonzo Cloud who has interim history of URI symptoms with concern for strep throat , for which she was treated with antibiotics and steroids. Patient now returns to ED  with complaint of n/v/,abdominal pain, chest pain and sob. Patient currently states s/p treatment in ED she  feels improved no current abdominal pain, n/v/ or chest discomfort. Patient also endorse new  rash on face after starting amoxicillin 2-3 days ago .   ED Course:  BP 149/138 sat 97%rr   Hr:107, rr 26 EKG: sinus tachycardia  Wbc 8.8, hgb 15.4, MCV 89.9, plt 313 Lipase 29  N a 128 ( 129)  glu 386, biacrb 7  cr 0.98 AG 23,  Beta-hydroxybutyric acid >8 Ph 7.08/ 25 Cxr NAD UA + ketones  Tx pepcid IV benadryl, ns 1796, KCL , insulin drip   Review of Systems: As per HPI otherwise 10 point review of systems negative.   Past Medical History:  Diagnosis Date   Diabetes (HCC)     History reviewed. No pertinent surgical history.   reports that she has never smoked. She has never used smokeless tobacco. She reports that she does not drink alcohol and does not use drugs.  No Known Allergies  Family History  Problem Relation Age of Onset   Diabetes Mother    Diabetes Father    Prior to Admission medications   Medication Sig Start Date End Date Taking? Authorizing Provider  amoxicillin (AMOXIL) 500 MG capsule Take 500 mg by mouth 2 (two) times daily. 07/28/22  Yes [provider]  cetirizine (ZYRTEC) 10 MG tablet Take 10 mg by mouth daily.   Yes [provider]  predniSONE (DELTASONE) 20  MG tablet Take 60 mg by mouth every morning. 07/28/22  Yes [provider]  promethazine (PHENERGAN) 25 MG tablet Take 25 mg by mouth every 6 (six) hours as needed for nausea or vomiting. 07/28/22  Yes [provider]  Continuous Blood Gluc Sensor (DEXCOM G6 SENSOR) MISC 1 Device by Does not apply route as directed. 12/16/20   Shamleffer, Konrad Dolores, MD  Continuous Blood Gluc Transmit (DEXCOM G6 TRANSMITTER) MISC USE 1 DEVICE. DOES NOT APPLY ROUTE AS DIRECTED 05/03/21   Shamleffer, Konrad Dolores, MD  ibuprofen (ADVIL) 800 MG tablet Take 1 tablet (800 mg total) by mouth every 8 (eight) hours as needed (pain). Patient not taking: Reported on 07/30/2022 10/24/21   Zenia Resides, MD  Insulin Disposable Pump (OMNIPOD 5 G6 INTRO, GEN 5,) KIT 1 Device by Does not apply route every 3 (three) days. 02/22/21   Shamleffer, Konrad Dolores, MD  Insulin Disposable Pump (OMNIPOD 5 G6 POD, GEN 5,) MISC 1 Device by Does not apply route every 3 (three) days. 02/22/21   Shamleffer, Konrad Dolores, MD  insulin lispro (HUMALOG) 100 UNIT/ML injection Max daily 60 units Patient not taking: Reported on 07/30/2022 12/06/21   Shamleffer, Konrad Dolores, MD  Insulin Pen Needle 30G X 5 MM MISC 1 Device by Does not apply route in the morning, at noon, in the evening, and at bedtime. 06/09/20  Shamleffer, Konrad Dolores, MD  magnesium oxide (MAG-OX) 400 (241.3 Mg) MG tablet Take 1 tablet (400 mg total) by mouth daily. Patient not taking: Reported on 07/30/2022 06/01/20   Pokhrel, Rebekah Chesterfield, MD  methocarbamol (ROBAXIN) 500 MG tablet Take 1 tablet (500 mg total) by mouth every 8 (eight) hours as needed for muscle spasms. Patient not taking: Reported on 07/30/2022 07/03/20   Haskel Schroeder, PA-C    Physical Exam: Vitals:   07/30/22 0300 07/30/22 0330 07/30/22 0400 07/30/22 0430  BP: (!) 156/99 (!) 140/108 (!) 161/103 (!) 124/98  Pulse:  (!) 122 (!) 110 (!) 113  Resp: (!) 24 (!) 27 (!) 24 (!) 26  Temp:       TempSrc:      SpO2:  100% 100% 100%  Weight:      Height:        Constitutional: NAD, calm, comfortable Vitals:   07/30/22 0300 07/30/22 0330 07/30/22 0400 07/30/22 0430  BP: (!) 156/99 (!) 140/108 (!) 161/103 (!) 124/98  Pulse:  (!) 122 (!) 110 (!) 113  Resp: (!) 24 (!) 27 (!) 24 (!) 26  Temp:      TempSrc:      SpO2:  100% 100% 100%  Weight:      Height:       Eyes: PERRL, lids and conjunctivae normal ENMT: Mucous membranes are moist. Posterior pharynx clear of any exudate or lesions.Normal dentition.  Neck: normal, supple, no masses, no thyromegaly Respiratory: clear to auscultation bilaterally, no wheezing, no crackles. Normal respiratory effort. No accessory muscle use.  Cardiovascular: Regular rate and rhythm, no murmurs / rubs / gallops. No extremity edema. 2+ pedal pulses. No carotid bruits.  Abdomen: no tenderness, no masses palpated. No hepatosplenomegaly. Bowel sounds positive.  Musculoskeletal: no clubbing / cyanosis. No joint deformity upper and lower extremities. Good ROM, no contractures. Normal muscle tone.  Skin: malar rash, lesions, ulcers. No induration, hirsutism on chest and abdomen Neurologic: CN 2-12 grossly intact. Sensation intact,  Strength 5/5 in all 4.  Psychiatric: Normal judgment and insight. Alert and oriented x 3. Normal mood.    Labs on Admission: I have personally reviewed following labs and imaging studies  CBC: Recent Labs  Lab 07/30/22 0207  WBC 8.8  HGB 15.4*  HCT 46.4*  MCV 89.9  PLT 313   Basic Metabolic Panel: Recent Labs  Lab 07/30/22 0207  NA 128*  K 4.2  CL 98  CO2 7*  GLUCOSE 386*  BUN 11  CREATININE 0.98  CALCIUM 8.9   GFR: Estimated Creatinine Clearance: 104.7 mL/min (by C-G formula based on SCr of 0.98 mg/dL). Liver Function Tests: Recent Labs  Lab 07/30/22 0207  AST 17  ALT 14  ALKPHOS 101  BILITOT 1.2  PROT 9.8*  ALBUMIN 4.9   Recent Labs  Lab 07/30/22 0207  LIPASE 29   No results for  input(s): "AMMONIA" in the last 168 hours. Coagulation Profile: No results for input(s): "INR", "PROTIME" in the last 168 hours. Cardiac Enzymes: No results for input(s): "CKTOTAL", "CKMB", "CKMBINDEX", "TROPONINI" in the last 168 hours. BNP (last 3 results) No results for input(s): "PROBNP" in the last 8760 hours. HbA1C: No results for input(s): "HGBA1C" in the last 72 hours. CBG: Recent Labs  Lab 07/30/22 0208 07/30/22 0328 07/30/22 0445  GLUCAP 406* 406* 387*   Lipid Profile: No results for input(s): "CHOL", "HDL", "LDLCALC", "TRIG", "CHOLHDL", "LDLDIRECT" in the last 72 hours. Thyroid Function Tests: No results for input(s): "TSH", "T4TOTAL", "FREET4", "  T3FREE", "THYROIDAB" in the last 72 hours. Anemia Panel: No results for input(s): "VITAMINB12", "FOLATE", "FERRITIN", "TIBC", "IRON", "RETICCTPCT" in the last 72 hours. Urine analysis:    Component Value Date/Time   COLORURINE STRAW (A) 07/30/2022 0418   APPEARANCEUR HAZY (A) 07/30/2022 0418   LABSPEC 1.027 07/30/2022 0418   PHURINE 5.0 07/30/2022 0418   GLUCOSEU >=500 (A) 07/30/2022 0418   HGBUR NEGATIVE 07/30/2022 0418   BILIRUBINUR NEGATIVE 07/30/2022 0418   KETONESUR 80 (A) 07/30/2022 0418   PROTEINUR 100 (A) 07/30/2022 0418   NITRITE NEGATIVE 07/30/2022 0418   LEUKOCYTESUR NEGATIVE 07/30/2022 0418    Radiological Exams on Admission: DG Chest Portable 1 View  Result Date: 07/30/2022 CLINICAL DATA:  Chest pain, nausea, vomiting EXAM: PORTABLE CHEST 1 VIEW COMPARISON:  Radiograph 05/29/2020 FINDINGS: The heart size and mediastinal contours are within normal limits. Both lungs are clear. The visualized skeletal structures are unremarkable. IMPRESSION: No active disease. Electronically Signed   By: Minerva Fester M.D.   On: 07/30/2022 03:53    EKG: Independently reviewed. See above   Assessment/Plan  Severe DKA in type 1.5 DM -insetting of ? Viral illness and steroid use  -admit to SDU -continue on DKA protocol  with insulin drip -ivfs per protocol  - of note  cxr/ ua negative  -full respiratory panel pending  -vbg : ph 7.06 , /AG 23repeat vbg now  -endocrine consult   Psuedo hyponatremia - monitor labs  -continue with DKA treatment as mentioned above   Malar rash  -possibly drug induced ( amoxicillin)  -will check inflammatory markers -check ana, esr/crp/ anti-histone ab   DVT prophylaxis: scd Code Status: full/ as discussed per patient wishes in event of cardiac arrest  Family Communication: none at bedside Disposition Plan:patient  expected to be admitted greater than 2 midnights  Consults called: endocrine  Admission status: SDU    Lurline Del MD [facility]   If 7PM-7AM, please contact night-coverage [email or web address] Password [facility]  07/30/2022, 5:31 AM

## 2022-07-30 NOTE — ED Triage Notes (Signed)
Pt. Arrives POV stating that she was recently diagnosed with a throat infection. She has been taking amoxicillin, prednisone, and promethazine for 2 days. When she woke up she had acid reflux. She ended up vomiting and with a headache. Pt. Is now c/o a headache, SOB, abdominal pain and chest pain.

## 2022-07-30 NOTE — ED Provider Notes (Signed)
[place] EMERGENCY DEPARTMENT AT [facility] Provider Note  CSN: [id number] Arrival date & time: 07/30/22 0136  Chief Complaint(s) Abdominal Pain  HPI Susan [name] is a 22 y.o. female with a past medical history listed below including type 1 diabetes followed by endocrinology not currently on insulin which she reports was a decision by her in the endocrinologist due to her ability to control her blood sugars off of insulin.  She presents today for abdominal discomfort and chest burning with nausea and vomiting.  Patient was seen a few days ago for sore throat and had a negative strep test.  She was started on Augmentin nonetheless as well as steroids.  Other than that she denies any other infectious symptoms.  Reports that she developed a facial rash and left arm rash last night which she attributed to possible reaction to detergent which she has used for many years.  Chest pain is burning sensation.  Nonradiating.  Nonexertional.  No associated shortness of breath.  No coughing or congestion.  Slightly improved since initial onset approximately 10 hours ago. The history is provided by the patient.    Past Medical History Past Medical History:  Diagnosis Date   Diabetes [facility])    Patient Active Problem List   Diagnosis Date Noted   Hirsutism 10/12/2020   DKA (diabetic ketoacidosis) (HCC) 04/18/2020   Hyponatremia 04/18/2020   Type 1 diabetes mellitus with hyperglycemia (HCC) 12/25/2018   Hypokalemia 12/05/2018   Hypophosphatemia 12/05/2018   DKA, type 1 (HCC) 12/03/2018   Home Medication(s) Prior to Admission medications   Medication Sig Start Date End Date Taking? Authorizing Provider  amoxicillin (AMOXIL) 500 MG capsule Take 500 mg by mouth 2 (two) times daily. 07/28/22  Yes [provider]  cetirizine (ZYRTEC) 10 MG tablet Take 10 mg by mouth daily.   Yes [provider]  predniSONE (DELTASONE) 20 MG tablet Take 60 mg by mouth every morning. 07/28/22   Yes [provider]  promethazine (PHENERGAN) 25 MG tablet Take 25 mg by mouth every 6 (six) hours as needed for nausea or vomiting. 07/28/22  Yes [provider]  Continuous Blood Gluc Sensor (DEXCOM G6 SENSOR) MISC 1 Device by Does not apply route as directed. 12/16/20   Shamleffer, Konrad Dolores, MD  Continuous Blood Gluc Transmit (DEXCOM G6 TRANSMITTER) MISC USE 1 DEVICE. DOES NOT APPLY ROUTE AS DIRECTED 05/03/21   Shamleffer, Konrad Dolores, MD  ibuprofen (ADVIL) 800 MG tablet Take 1 tablet (800 mg total) by mouth every 8 (eight) hours as needed (pain). Patient not taking: Reported on 07/30/2022 10/24/21   Zenia Resides, MD  Insulin Disposable Pump (OMNIPOD 5 G6 INTRO, GEN 5,) KIT 1 Device by Does not apply route every 3 (three) days. 02/22/21   Shamleffer, Konrad Dolores, MD  Insulin Disposable Pump (OMNIPOD 5 G6 POD, GEN 5,) MISC 1 Device by Does not apply route every 3 (three) days. 02/22/21   Shamleffer, Konrad Dolores, MD  insulin lispro (HUMALOG) 100 UNIT/ML injection Max daily 60 units Patient not taking: Reported on 07/30/2022 12/06/21   Shamleffer, Konrad Dolores, MD  Insulin Pen Needle 30G X 5 MM MISC 1 Device by Does not apply route in the morning, at noon, in the evening, and at bedtime. 06/09/20   Shamleffer, Konrad Dolores, MD  magnesium oxide (MAG-OX) 400 (241.3 Mg) MG tablet Take 1 tablet (400 mg total) by mouth daily. Patient not taking: Reported on 07/30/2022 06/01/20   Joycelyn Das, MD  methocarbamol (ROBAXIN) 500  MG tablet Take 1 tablet (500 mg total) by mouth every 8 (eight) hours as needed for muscle spasms. Patient not taking: Reported on 07/30/2022 07/03/20   Haskel Schroeder, PA-C                                                                                                                                    Allergies Patient has no known allergies.  Review of Systems Review of Systems As noted in HPI  Physical Exam Vital Signs  I have  reviewed the triage vital signs BP (!) 141/88   Pulse (!) 104   Temp 98.1 F (36.7 C) (Oral)   Resp (!) 23   Ht 5\' 8"  (1.727 m)   Wt 89.8 kg   SpO2 100%   BMI 30.11 kg/m   Physical Exam Vitals reviewed.  Constitutional:      General: She is not in acute distress.    Appearance: She is well-developed. She is not diaphoretic.  HENT:     Head: Normocephalic and atraumatic.     Nose: Nose normal.     Mouth/Throat:     Palate: No lesions.     Pharynx: Posterior oropharyngeal erythema present.     Tonsils: Tonsillar exudate present. No tonsillar abscesses.  Eyes:     General: No scleral icterus.       Right eye: No discharge.        Left eye: No discharge.     Conjunctiva/sclera: Conjunctivae normal.     Pupils: Pupils are equal, round, and reactive to light.  Cardiovascular:     Rate and Rhythm: Normal rate and regular rhythm.     Heart sounds: No murmur heard.    No friction rub. No gallop.  Pulmonary:     Effort: Pulmonary effort is normal. No respiratory distress.     Breath sounds: Normal breath sounds. No stridor. No rales.  Abdominal:     General: There is no distension.     Palpations: Abdomen is soft.     Tenderness: There is abdominal tenderness in the epigastric area.  Musculoskeletal:        General: No tenderness.     Cervical back: Normal range of motion and neck supple.  Skin:    General: Skin is warm and dry.     Findings: No erythema or rash.  Neurological:     Mental Status: She is alert and oriented to person, place, and time.     ED Results and Treatments Labs (all labs ordered are listed, but only abnormal results are displayed) Labs Reviewed  COMPREHENSIVE METABOLIC PANEL - Abnormal; Notable for the following components:      Result Value   Sodium 128 (*)    CO2 7 (*)    Glucose, Bld 386 (*)    Total Protein 9.8 (*)    Anion gap 23 (*)    All other components within normal  limits  CBC - Abnormal; Notable for the following components:    RBC 5.16 (*)    Hemoglobin 15.4 (*)    HCT 46.4 (*)    All other components within normal limits  URINALYSIS, ROUTINE W REFLEX MICROSCOPIC - Abnormal; Notable for the following components:   Color, Urine STRAW (*)    APPearance HAZY (*)    Glucose, UA >=500 (*)    Ketones, ur 80 (*)    Protein, ur 100 (*)    All other components within normal limits  BLOOD GAS, VENOUS - Abnormal; Notable for the following components:   pH, Ven 7.08 (*)    pCO2, Ven 25 (*)    pO2, Ven <31 (*)    Bicarbonate 7.5 (*)    Acid-base deficit 21.3 (*)    All other components within normal limits  BETA-HYDROXYBUTYRIC ACID - Abnormal; Notable for the following components:   Beta-Hydroxybutyric Acid >8.00 (*)    All other components within normal limits  BLOOD GAS, VENOUS - Abnormal; Notable for the following components:   pH, Ven 7.09 (*)    pCO2, Ven 27 (*)    pO2, Ven <31 (*)    Bicarbonate 8.2 (*)    Acid-base deficit 20.3 (*)    All other components within normal limits  CBG MONITORING, ED - Abnormal; Notable for the following components:   Glucose-Capillary 406 (*)    All other components within normal limits  CBG MONITORING, ED - Abnormal; Notable for the following components:   Glucose-Capillary 406 (*)    All other components within normal limits  CBG MONITORING, ED - Abnormal; Notable for the following components:   Glucose-Capillary 387 (*)    All other components within normal limits  CBG MONITORING, ED - Abnormal; Notable for the following components:   Glucose-Capillary 314 (*)    All other components within normal limits  CBG MONITORING, ED - Abnormal; Notable for the following components:   Glucose-Capillary 228 (*)    All other components within normal limits  CBG MONITORING, ED - Abnormal; Notable for the following components:   Glucose-Capillary 189 (*)    All other components within normal limits  GROUP A STREP BY PCR  RESPIRATORY PANEL BY PCR  LIPASE, BLOOD  ANA W/REFLEX IF  POSITIVE  HISTONE ANTIBODIES, IGG, BLOOD  SEDIMENTATION RATE  C-REACTIVE PROTEIN  BASIC METABOLIC PANEL  BASIC METABOLIC PANEL  BASIC METABOLIC PANEL  HIV ANTIBODY (ROUTINE TESTING W REFLEX)  CBC  HEMOGLOBIN A1C  TSH  URINALYSIS, ROUTINE W REFLEX MICROSCOPIC  I-STAT BETA HCG BLOOD, ED ([facility], [facility], [facility] ONLY)                                                                                                                         EKG  EKG Interpretation  Date/Time:  Monday July 30 2022 02:45:24 EDT Ventricular Rate:  121 PR Interval:  69 QRS Duration: 88 QT Interval:  307 QTC Calculation: 436 R Axis:  55 Text Interpretation: Sinus tachycardia Probable left atrial enlargement Borderline repolarization abnormality Confirmed by Drema Pry [phone]) on 07/30/2022 3:13:29 AM       Radiology DG Chest Portable 1 View  Result Date: 07/30/2022 CLINICAL DATA:  Chest pain, nausea, vomiting EXAM: PORTABLE CHEST 1 VIEW COMPARISON:  Radiograph 05/29/2020 FINDINGS: The heart size and mediastinal contours are within normal limits. Both lungs are clear. The visualized skeletal structures are unremarkable. IMPRESSION: No active disease. Electronically Signed   By: Minerva Fester M.D.   On: 07/30/2022 03:53    Medications Ordered in ED Medications  insulin regular, human (MYXREDLIN) 100 units/ 100 mL infusion (7 Units/hr Intravenous Infusion Verify 07/30/22 0755)  lactated ringers infusion (0 mLs Intravenous Stopped 07/30/22 0654)  dextrose 5 % in lactated ringers infusion ( Intravenous New Bag/Given 07/30/22 0655)  dextrose 50 % solution 0-50 mL (has no administration in time range)  heparin injection 5,000 Units (has no administration in time range)  acetaminophen (TYLENOL) tablet 650 mg (has no administration in time range)    Or  acetaminophen (TYLENOL) suppository 650 mg (has no administration in time range)  ondansetron (ZOFRAN) tablet 4 mg (has no administration in time range)    Or   ondansetron (ZOFRAN) injection 4 mg (has no administration in time range)  levalbuterol (XOPENEX) nebulizer solution 0.63 mg (has no administration in time range)  ondansetron (ZOFRAN) injection 4 mg (4 mg Intravenous Given 07/30/22 0307)  sodium chloride 0.9 % bolus 1,000 mL (0 mLs Intravenous Stopped 07/30/22 0402)  famotidine (PEPCID) IVPB 20 mg premix (0 mg Intravenous Stopped 07/30/22 0359)  diphenhydrAMINE (BENADRYL) injection 25 mg (25 mg Intravenous Given 07/30/22 0319)  lactated ringers bolus 1,796 mL (1,796 mLs Intravenous New Bag/Given 07/30/22 0427)  potassium chloride 10 mEq in 100 mL IVPB (0 mEq Intravenous Stopped 07/30/22 0622)  acetaminophen (TYLENOL) tablet 1,000 mg (1,000 mg Oral Given 07/30/22 0544)   Procedures .Critical Care  Performed by: Nira Conn, MD Authorized by: Nira Conn, MD   Critical care provider statement:    Critical care time (minutes):  45   Critical care time was exclusive of:  Separately billable procedures and treating other patients   Critical care was necessary to treat or prevent imminent or life-threatening deterioration of the following conditions:  Metabolic crisis   Critical care was time spent personally by me on the following activities:  Development of treatment plan with patient or surrogate, discussions with consultants, evaluation of patient's response to treatment, examination of patient, obtaining history from patient or surrogate, review of old charts, re-evaluation of patient's condition, pulse oximetry, ordering and review of radiographic studies, ordering and review of laboratory studies and ordering and performing treatments and interventions   Care discussed with: admitting provider     (including critical care time) Medical Decision Making / ED Course   Medical Decision Making Amount and/or Complexity of Data Reviewed Labs: ordered. Radiology: ordered.  Risk OTC drugs. Prescription drug  management. Decision regarding hospitalization.    Patient presents with upper abdominal pain and chest burning.  She is tachycardic.  Reported history of pharyngitis.  Facial rash/flushing.  Patient was started on IV fluids for tachycardia.  Upper abdominal pain and chest pain is likely GI related.  Given her clinical presentation with recent history and history of diabetes, will need to rule out DKA, electrolyte derangements or renal sufficiency.  Will also assess for any other sources of infection.  I have low suspicion for ACS given her clinical  picture.  EKG shows sinus tachycardia without acute ischemic changes, dysrhythmias or blocks.  No evidence of pericarditis.  Chest x-ray without evidence of pneumonia. Rapid strep repeated and negative.  Rash may be an allergic reaction to antibiotic or detergent or side effect from prednisone.  Patient was given Pepcid and Benadryl.  CBC without leukocytosis.  Elevated hemoglobin and hematocrit concerning for hemoconcentration.  CMP with hyponatremia and hyperglycemia.  Hyponatremia is likely pseudohyponatremia from hyperglycemia.  Patient does have evidence of DKA with a bicarb of 7 and anion gap of 23.  Beta hydroxybutyric acid of 8.   VBG consistent with metabolic acidosis. UA without evidence of infection  Patient started on insulin drip and additional IV fluids.  Consulted hospitalist service and spoke with Dr. Maisie Fus who admitted patient for further workup and management.    Final Clinical Impression(s) / ED Diagnoses Final diagnoses:  Diabetic ketoacidosis without coma associated with type 1 diabetes mellitus (HCC)  Pharyngitis, unspecified etiology    This chart was dictated using voice recognition software.  Despite best efforts to proofread,  errors can occur which can change the documentation meaning.    Nira Conn, MD 07/30/22 [phone]

## 2022-07-30 NOTE — ED Notes (Addendum)
Pt sustained tachycardia observed rate up to 160s, BP also elevated, and pt reports epigastric burning. Pt called out reporting she vomited, and tech reports half an emesis bag full of clear/yellow emesis at this time. Doctor performing I&D in another room, so VOR received for IV placement, and IVF Bolus and x1 dose of 4mg  IVP Zofran.

## 2022-07-30 NOTE — Progress Notes (Signed)
Susan [name] is a 22 y.o. female with medical history significant of  DM type I not currently on insulin with last A1c in >7 followed by endocrine Dr Lonzo Cloud who has interim history of URI symptoms with concern for strep throat , for which she was treated with antibiotics and steroids. Patient now returns to ED  with complaint of n/v/,abdominal pain, chest pain and sob.  Patient was admitted with DKA.  Patient seen and evaluated at bedside and has been admitted after midnight.  She continues to remain on insulin drip as well as IV fluids with ongoing severe acidosis.  Symptomatically she is starting to improve.  Need to continue current IV infusions until acidosis resolves at which point she could be transition.  Mild hypokalemia noted which will be repleted.  Continue to follow repeat labs.  Appreciate diabetes coordinator recommendations.  Total care time: 35 minutes.

## 2022-07-31 ENCOUNTER — Other Ambulatory Visit (HOSPITAL_COMMUNITY): Payer: Self-pay

## 2022-07-31 ENCOUNTER — Telehealth: Payer: Self-pay | Admitting: Internal Medicine

## 2022-07-31 DIAGNOSIS — E1010 Type 1 diabetes mellitus with ketoacidosis without coma: Secondary | ICD-10-CM | POA: Diagnosis not present

## 2022-07-31 LAB — ENA+DNA/DS+ANTICH+CENTRO+JO...
Anti JO-1: <0.2 AI (ref 0.0–0.9)
Centromere Ab Screen: <0.2 AI (ref 0.0–0.9)
Chromatin Ab SerPl-aCnc: 1.8 AI — ABNORMAL HIGH (ref 0.0–0.9)
ENA SM Ab Ser-aCnc: <0.2 AI (ref 0.0–0.9)
Ribonucleic Protein: 6.9 AI — ABNORMAL HIGH (ref 0.0–0.9)
SSA (Ro) (ENA) Antibody, IgG: <0.2 AI (ref 0.0–0.9)
SSB (La) (ENA) Antibody, IgG: <0.2 AI (ref 0.0–0.9)
Scleroderma (Scl-70) (ENA) Antibody, IgG: <0.2 AI (ref 0.0–0.9)
ds DNA Ab: 1 IU/mL (ref 0–9)

## 2022-07-31 LAB — CBC
HCT: 36.9 % (ref 36.0–46.0)
Hemoglobin: 12.4 g/dL (ref 12.0–15.0)
MCH: 29.4 pg (ref 26.0–34.0)
MCHC: 33.6 g/dL (ref 30.0–36.0)
MCV: 87.4 fL (ref 80.0–100.0)
Platelets: 240 10*3/uL (ref 150–400)
RBC: 4.22 MIL/uL (ref 3.87–5.11)
RDW: 12.5 % (ref 11.5–15.5)
WBC: 5.9 10*3/uL (ref 4.0–10.5)
nRBC: 0 % (ref 0.0–0.2)

## 2022-07-31 LAB — COMPREHENSIVE METABOLIC PANEL
ALT: 11 U/L (ref 0–44)
AST: 15 U/L (ref 15–41)
Albumin: 3.7 g/dL (ref 3.5–5.0)
Alkaline Phosphatase: 68 U/L (ref 38–126)
Anion gap: 8 (ref 5–15)
BUN: 9 mg/dL (ref 6–20)
CO2: 18 mmol/L — ABNORMAL LOW (ref 22–32)
Calcium: 8.9 mg/dL (ref 8.9–10.3)
Chloride: 106 mmol/L (ref 98–111)
Creatinine, Ser: 0.61 mg/dL (ref 0.44–1.00)
GFR, Estimated: >60 mL/min (ref >60)
Glucose, Bld: 259 mg/dL — ABNORMAL HIGH (ref 70–99)
Potassium: 3.9 mmol/L (ref 3.5–5.1)
Sodium: 132 mmol/L — ABNORMAL LOW (ref 135–145)
Total Bilirubin: 1.3 mg/dL — ABNORMAL HIGH (ref 0.3–1.2)
Total Protein: 7.3 g/dL (ref 6.5–8.1)

## 2022-07-31 LAB — GLUCOSE, CAPILLARY
Glucose-Capillary: 249 mg/dL — ABNORMAL HIGH (ref 70–99)
Glucose-Capillary: 257 mg/dL — ABNORMAL HIGH (ref 70–99)

## 2022-07-31 LAB — MAGNESIUM: Magnesium: 2 mg/dL (ref 1.7–2.4)

## 2022-07-31 LAB — ANA W/REFLEX IF POSITIVE: Anti Nuclear Antibody (ANA): POSITIVE — AB

## 2022-07-31 LAB — PROTIME-INR
INR: 1.1 (ref 0.8–1.2)
Prothrombin Time: 13.9 seconds (ref 11.4–15.2)

## 2022-07-31 LAB — BETA-HYDROXYBUTYRIC ACID: Beta-Hydroxybutyric Acid: 2.16 mmol/L — ABNORMAL HIGH (ref 0.05–0.27)

## 2022-07-31 LAB — HISTONE ANTIBODIES, IGG, BLOOD: DNA-Histone: 0.4 Units (ref 0.0–0.9)

## 2022-07-31 MED ORDER — INSULIN GLARGINE-YFGN 100 UNIT/ML ~~LOC~~ SOPN
12.0000 [IU] | PEN_INJECTOR | Freq: Every day | SUBCUTANEOUS | 3 refills | Status: DC
  Filled 2022-07-31: qty 3, 25d supply, fill #0

## 2022-07-31 MED ORDER — POTASSIUM CHLORIDE 10 MEQ/100ML IV SOLN
10.0000 meq | Freq: Once | INTRAVENOUS | Status: AC
  Administered 2022-07-31: 10 meq via INTRAVENOUS
  Filled 2022-07-31: qty 100

## 2022-07-31 MED ORDER — INSULIN PEN NEEDLE 32G X 4 MM MISC
1.0000 | Freq: Three times a day (TID) | 10 refills | Status: DC
  Filled 2022-07-31: qty 200, 50d supply, fill #0

## 2022-07-31 MED ORDER — INSULIN ASPART 100 UNIT/ML FLEXPEN
1.0000 [IU] | PEN_INJECTOR | Freq: Three times a day (TID) | SUBCUTANEOUS | 11 refills | Status: DC
  Filled 2022-07-31: qty 9, 33d supply, fill #0

## 2022-07-31 MED ORDER — INSULIN ASPART 100 UNIT/ML FLEXPEN
4.0000 [IU] | PEN_INJECTOR | Freq: Three times a day (TID) | SUBCUTANEOUS | 11 refills | Status: DC
  Filled 2022-07-31: qty 15, fill #0

## 2022-07-31 MED ORDER — INSULIN ASPART 100 UNIT/ML IJ SOLN
0.0000 [IU] | Freq: Three times a day (TID) | INTRAMUSCULAR | Status: DC
  Administered 2022-07-31: 5 [IU] via SUBCUTANEOUS

## 2022-07-31 NOTE — Inpatient Diabetes Management (Signed)
Inpatient Diabetes Program Recommendations  AACE/ADA: New Consensus Statement on Inpatient Glycemic Control (2015)  Target Ranges:  Prepandial:   less than 140 mg/dL      Peak postprandial:   less than 180 mg/dL (1-2 hours)      Critically ill patients:  140 - 180 mg/dL   Lab Results  Component Value Date   GLUCAP 257 (H) 07/31/2022   HGBA1C 11.9 (H) 07/30/2022    Review of Glycemic Control   Inpatient Diabetes Program Recommendations:    Discharge Recommendations: Long acting recommendations: Insulin Glargine-yfgn (SEMGLEE) Pen 12 units QD  Short acting recommendations:  Meal + Correction coverage Insulin aspart (NOVOLOG) FlexPen  Sensitive Scale.  4 units TID  Thank you. Ailene Ards, RD, LDN, CDCES Inpatient Diabetes Coordinator [phone]

## 2022-07-31 NOTE — TOC Benefit Eligibility Note (Signed)
Patient Product/process development scientist completed.    The patient is currently admitted and upon discharge could be taking Semglee Pen.  The current 30 day co-pay is $4.00.   The patient is currently admitted and upon discharge could be taking Novolog FlexPen.  The current 30 day co-pay is $4.00.   The patient is currently admitted and upon discharge could be taking Dexcom G7 Sensors.  Requires Prior Authorization  The patient is insured through [employer] and [employer] [place] Medicaid   This test claim was processed through [facility]- copay amounts may vary at other pharmacies due to pharmacy/plan contracts, or as the patient moves through the different stages of their insurance plan.  Roland Earl, CPHT Pharmacy Patient Advocate Specialist [facility] Patient Advocate Team Direct Number: [phone]  Fax: [phone]

## 2022-07-31 NOTE — Telephone Encounter (Addendum)
Patient notified and confirmed tomorrow at 12:10--08/01/2022

## 2022-07-31 NOTE — Discharge Summary (Signed)
Physician Discharge Summary   Susan [name] ZOX:[id number] DOB: 2000/06/01 DOA: 07/30/2022  PCP: Oneita Hurt, No  Admit date: 07/30/2022 Discharge date: 07/31/2022   Admitted From: Home Disposition:  Home Discharging physician: Lewie Chamber, MD Barriers to discharge: none  Recommendations at discharge: Follow up with endocrinology; patient on insulin pump in past? For now discharged on Semglee and Novolog Follow up Histone Ab     Discharge Condition: stable CODE STATUS: Full Diet recommendation:  Diet Orders (From admission, onward)     Start     Ordered   07/31/22 0000  Diet Carb Modified        07/31/22 0913   07/30/22 2009  Diet Carb Modified Fluid consistency: Thin; Room service appropriate? Yes  Diet effective now       Question Answer Comment  Diet-HS Snack? Nothing   Calorie Level Medium 1600-2000   Fluid consistency: Thin   Room service appropriate? Yes      07/30/22 2009            Hospital Course:  Severe DKA in type 1 DM -in setting of ? Viral illness and steroid use  - started on insulin drip and IVF; responded well -Transitioned to [employer] and NovoLog sliding scale -Seen by diabetes coordinator prior to discharge as well -Patient instructed to discontinue steroid at discharge -Unclear her home insulin regimen use.  Prior history of insulin pump but may have also been trying to get by with diet control? - recommended outpatient followup with PCP or endocrinology as well to discuss long term treatment    Psuedo hyponatremia - monitor labs  -continue with DKA treatment as mentioned above    Malar rash  -possibly drug induced ( amoxicillin)  - normal ESR and CRP - ANA was positive but nonspecific -Histone antibodies pending at discharge    The patient's chronic medical conditions were treated accordingly per the patient's home medication regimen except as noted.  On day of discharge, patient was felt deemed stable for discharge. Patient/family member  advised to call PCP or come back to ER if needed.   Principal Diagnosis: DKA (diabetic ketoacidosis) (HCC)  Discharge Diagnoses: Active Hospital Problems   Diagnosis Date Noted   DKA (diabetic ketoacidosis) (HCC) 04/18/2020    Resolved Hospital Problems  No resolved problems to display.     Discharge Instructions     Diet Carb Modified   Complete by: As directed    Increase activity slowly   Complete by: As directed       Allergies as of 07/31/2022   No Known Allergies      Medication List     STOP taking these medications    ibuprofen 800 MG tablet Commonly known as: ADVIL   insulin lispro 100 UNIT/ML injection Commonly known as: HUMALOG   magnesium oxide 400 (241.3 Mg) MG tablet Commonly known as: MAG-OX   methocarbamol 500 MG tablet Commonly known as: ROBAXIN   Omnipod 5 G6 Intro (Gen 5) Kit   Omnipod 5 G6 Pods (Gen 5) Misc   predniSONE 20 MG tablet Commonly known as: DELTASONE       TAKE these medications    amoxicillin 500 MG capsule Commonly known as: AMOXIL Take 500 mg by mouth 2 (two) times daily.   cetirizine 10 MG tablet Commonly known as: ZYRTEC Take 10 mg by mouth daily.   Dexcom G6 Sensor Misc 1 Device by Does not apply route as directed.   Dexcom G6 Transmitter Misc USE 1 DEVICE. DOES NOT  APPLY ROUTE AS DIRECTED   insulin aspart 100 UNIT/ML FlexPen Commonly known as: NOVOLOG Inject 4 Units into the skin 3 (three) times daily with meals.   NovoLOG FlexPen 100 UNIT/ML FlexPen Generic drug: insulin aspart Inject 1-9 Units into the skin 3 times daily with meals. CBG 121 - 150: 1 unit-- CBG 151 - 200: 2 units-- CBG 201 - 250: 3 units-- CBG 251 - 300: 5 units-- CBG 301 - 350: 7 units-- CBG 351 - 400: 9 units   Insulin Pen Needle 30G X 5 MM Misc 1 Device by Does not apply route in the morning, at noon, in the evening, and at bedtime. What changed: Another medication with the same name was added. Make sure you understand how and  when to take each.   TechLite Plus Pen Needles 32G X 4 MM Misc Generic drug: Insulin Pen Needle Use 4 times daily -  before meals and at bedtime. What changed: You were already taking a medication with the same name, and this prescription was added. Make sure you understand how and when to take each.   promethazine 25 MG tablet Commonly known as: PHENERGAN Take 25 mg by mouth every 6 (six) hours as needed for nausea or vomiting.   Semglee (yfgn) 100 UNIT/ML Pen Generic drug: insulin glargine-yfgn Inject 12 Units into the skin daily.        No Known Allergies   Discharge Exam: BP 134/76   Pulse 94   Temp 97.7 F (36.5 C) (Oral)   Resp 16   Ht 5\' 8"  (1.727 m)   Wt 90.8 kg   SpO2 100%   BMI 30.44 kg/m  Physical Exam Constitutional:      General: She is not in acute distress.    Appearance: She is well-developed.  HENT:     Head: Normocephalic and atraumatic.     Mouth/Throat:     Mouth: Mucous membranes are moist.  Eyes:     Extraocular Movements: Extraocular movements intact.  Cardiovascular:     Rate and Rhythm: Normal rate and regular rhythm.  Pulmonary:     Effort: Pulmonary effort is normal.     Breath sounds: Normal breath sounds.  Abdominal:     General: Bowel sounds are normal. There is no distension.     Palpations: Abdomen is soft.     Tenderness: There is no abdominal tenderness.  Musculoskeletal:        General: No swelling. Normal range of motion.     Cervical back: Normal range of motion and neck supple.  Skin:    General: Skin is warm and dry.  Neurological:     General: No focal deficit present.     Mental Status: She is alert.  Psychiatric:        Mood and Affect: Mood normal.      The results of significant diagnostics from this hospitalization (including imaging, microbiology, ancillary and laboratory) are listed below for reference.   Microbiology: Recent Results (from the past 240 hour(s))  Group A Strep by PCR     Status: None    Collection Time: 07/30/22  3:24 AM   Specimen: Throat; Sterile Swab  Result Value Ref Range Status   Group A Strep by PCR NOT DETECTED NOT DETECTED Final    Comment: Performed at [facility], [address] [place], [place] [address]  Respiratory (~20 pathogens) panel by PCR     Status: None   Collection Time: 07/30/22  6:41 AM  Specimen: Nasopharyngeal Swab; Respiratory  Result Value Ref Range Status   Adenovirus NOT DETECTED NOT DETECTED Final   Coronavirus 229E NOT DETECTED NOT DETECTED Final    Comment: (NOTE) The Coronavirus on the Respiratory Panel, DOES NOT test for the novel  Coronavirus (2019 nCoV)    Coronavirus HKU1 NOT DETECTED NOT DETECTED Final   Coronavirus NL63 NOT DETECTED NOT DETECTED Final   Coronavirus OC43 NOT DETECTED NOT DETECTED Final   Metapneumovirus NOT DETECTED NOT DETECTED Final   Rhinovirus / Enterovirus NOT DETECTED NOT DETECTED Final   Influenza A NOT DETECTED NOT DETECTED Final   Influenza B NOT DETECTED NOT DETECTED Final   Parainfluenza Virus 1 NOT DETECTED NOT DETECTED Final   Parainfluenza Virus 2 NOT DETECTED NOT DETECTED Final   Parainfluenza Virus 3 NOT DETECTED NOT DETECTED Final   Parainfluenza Virus 4 NOT DETECTED NOT DETECTED Final   Respiratory Syncytial Virus NOT DETECTED NOT DETECTED Final   Bordetella pertussis NOT DETECTED NOT DETECTED Final   Bordetella Parapertussis NOT DETECTED NOT DETECTED Final   Chlamydophila pneumoniae NOT DETECTED NOT DETECTED Final   Mycoplasma pneumoniae NOT DETECTED NOT DETECTED Final    Comment: Performed at [facility], [address], [place], [place] [address]  MRSA Next Gen by PCR, Nasal     Status: None   Collection Time: 07/30/22 12:53 PM   Specimen: Nasal Mucosa; Nasal Swab  Result Value Ref Range Status   MRSA by PCR Next Gen NOT DETECTED NOT DETECTED Final    Comment: (NOTE) The GeneXpert MRSA Assay (FDA approved for NASAL specimens only), is one component of a  comprehensive MRSA colonization surveillance program. It is not intended to diagnose MRSA infection nor to guide or monitor treatment for MRSA infections. Test performance is not FDA approved in patients less than 34 years old. Performed at [facility], [address] [place], [place] [address]      Labs: BNP (last 3 results) No results for input(s): "BNP" in the last 8760 hours. Basic Metabolic Panel: Recent Labs  Lab 07/30/22 0802 07/30/22 1222 07/30/22 1730 07/30/22 2037 07/31/22 0329  NA 133* 131* 134* 133* 132*  K 3.4* 3.8 2.9* 2.9* 3.9  CL 108 107 108 109 106  CO2 8* 14* 17* 17* 18*  GLUCOSE 186* 196* 139* 155* 259*  BUN 8 7 8 8 9   CREATININE 0.77 0.79 0.63 0.70 0.61  CALCIUM 8.3* 8.3* 9.1 8.7* 8.9  MG  --   --   --   --  2.0   Liver Function Tests: Recent Labs  Lab 07/30/22 0207 07/31/22 0329  AST 17 15  ALT 14 11  ALKPHOS 101 68  BILITOT 1.2 1.3*  PROT 9.8* 7.3  ALBUMIN 4.9 3.7   Recent Labs  Lab 07/30/22 0207  LIPASE 29   No results for input(s): "AMMONIA" in the last 168 hours. CBC: Recent Labs  Lab 07/30/22 0207 07/30/22 0802 07/31/22 0329  WBC 8.8 10.1 5.9  HGB 15.4* 13.0 12.4  HCT 46.4* 39.7 36.9  MCV 89.9 90.6 87.4  PLT 313 274 240   Cardiac Enzymes: No results for input(s): "CKTOTAL", "CKMB", "CKMBINDEX", "TROPONINI" in the last 168 hours. BNP: Invalid input(s): "POCBNP" CBG: Recent Labs  Lab 07/30/22 1816 07/30/22 1937 07/30/22 2337 07/31/22 0340 07/31/22 0746  GLUCAP 160* 153* 353* 249* 257*   D-Dimer No results for input(s): "DDIMER" in the last 72 hours. Hgb A1c Recent Labs    07/30/22 0802  HGBA1C  11.9*   Lipid Profile No results for input(s): "CHOL", "HDL", "LDLCALC", "TRIG", "CHOLHDL", "LDLDIRECT" in the last 72 hours. Thyroid function studies Recent Labs    07/30/22 0802  TSH 1.910   Anemia work up No results for input(s): "VITAMINB12", "FOLATE", "FERRITIN", "TIBC", "IRON",  "RETICCTPCT" in the last 72 hours. Urinalysis    Component Value Date/Time   COLORURINE STRAW (A) 07/30/2022 0418   APPEARANCEUR HAZY (A) 07/30/2022 0418   LABSPEC 1.027 07/30/2022 0418   PHURINE 5.0 07/30/2022 0418   GLUCOSEU >=500 (A) 07/30/2022 0418   HGBUR NEGATIVE 07/30/2022 0418   BILIRUBINUR NEGATIVE 07/30/2022 0418   KETONESUR 80 (A) 07/30/2022 0418   PROTEINUR 100 (A) 07/30/2022 0418   NITRITE NEGATIVE 07/30/2022 0418   LEUKOCYTESUR NEGATIVE 07/30/2022 0418   Sepsis Labs Recent Labs  Lab 07/30/22 0207 07/30/22 0802 07/31/22 0329  WBC 8.8 10.1 5.9   Microbiology Recent Results (from the past 240 hour(s))  Group A Strep by PCR     Status: None   Collection Time: 07/30/22  3:24 AM   Specimen: Throat; Sterile Swab  Result Value Ref Range Status   Group A Strep by PCR NOT DETECTED NOT DETECTED Final    Comment: Performed at [facility], [address] [place], [place] [address]  Respiratory (~20 pathogens) panel by PCR     Status: None   Collection Time: 07/30/22  6:41 AM   Specimen: Nasopharyngeal Swab; Respiratory  Result Value Ref Range Status   Adenovirus NOT DETECTED NOT DETECTED Final   Coronavirus 229E NOT DETECTED NOT DETECTED Final    Comment: (NOTE) The Coronavirus on the Respiratory Panel, DOES NOT test for the novel  Coronavirus (2019 nCoV)    Coronavirus HKU1 NOT DETECTED NOT DETECTED Final   Coronavirus NL63 NOT DETECTED NOT DETECTED Final   Coronavirus OC43 NOT DETECTED NOT DETECTED Final   Metapneumovirus NOT DETECTED NOT DETECTED Final   Rhinovirus / Enterovirus NOT DETECTED NOT DETECTED Final   Influenza A NOT DETECTED NOT DETECTED Final   Influenza B NOT DETECTED NOT DETECTED Final   Parainfluenza Virus 1 NOT DETECTED NOT DETECTED Final   Parainfluenza Virus 2 NOT DETECTED NOT DETECTED Final   Parainfluenza Virus 3 NOT DETECTED NOT DETECTED Final   Parainfluenza Virus 4 NOT DETECTED NOT DETECTED Final   Respiratory  Syncytial Virus NOT DETECTED NOT DETECTED Final   Bordetella pertussis NOT DETECTED NOT DETECTED Final   Bordetella Parapertussis NOT DETECTED NOT DETECTED Final   Chlamydophila pneumoniae NOT DETECTED NOT DETECTED Final   Mycoplasma pneumoniae NOT DETECTED NOT DETECTED Final    Comment: Performed at [facility], [address], [place], [place] [address]  MRSA Next Gen by PCR, Nasal     Status: None   Collection Time: 07/30/22 12:53 PM   Specimen: Nasal Mucosa; Nasal Swab  Result Value Ref Range Status   MRSA by PCR Next Gen NOT DETECTED NOT DETECTED Final    Comment: (NOTE) The GeneXpert MRSA Assay (FDA approved for NASAL specimens only), is one component of a comprehensive MRSA colonization surveillance program. It is not intended to diagnose MRSA infection nor to guide or monitor treatment for MRSA infections. Test performance is not FDA approved in patients less than 34 years old. Performed at [facility], [address] [place], [place] [address]     Procedures/Studies: DG Chest Portable 1 View  Result Date: 07/30/2022 CLINICAL DATA:  Chest pain, nausea, vomiting EXAM: PORTABLE CHEST 1 VIEW  COMPARISON:  Radiograph 05/29/2020 FINDINGS: The heart size and mediastinal contours are within normal limits. Both lungs are clear. The visualized skeletal structures are unremarkable. IMPRESSION: No active disease. Electronically Signed   By: Minerva Fester M.D.   On: 07/30/2022 03:53     Time coordinating discharge: Over 30 minutes    Lewie Chamber, MD  [facility] 07/31/2022, 1:11 PM

## 2022-07-31 NOTE — TOC CM/SW Note (Signed)
Transition of Care [facility]) - Inpatient Brief Assessment  Patient Details  Name: Susan [name] MRN: [id number] Date of Birth: 26-Nov-2000  [facility] CM/SW Contact:    Ewing Schlein, LCSW Phone Number: 07/31/2022, 10:05 AM  Clinical Narrative: Screening done. No TOC needs at this time.  Transition of Care Asessment: Insurance and Status: Insurance coverage has been reviewed Patient has primary care physician: Yes (Patient is assigned a PCP by Medicaid.) Home environment has been reviewed: Yes Prior level of function:: Independent at baseline Prior/Current Home Services: No current home services Social Determinants of Health Reivew: SDOH reviewed no interventions necessary Readmission risk has been reviewed: Yes Transition of care needs: no transition of care needs at this time

## 2022-07-31 NOTE — Telephone Encounter (Signed)
Patient in hospital with DKA and is wanting to follow up with Dr. Lonzo Cloud, appointment made for December, and patient put on wait list. Please advise if she can be worked in sooner.

## 2022-08-01 ENCOUNTER — Encounter: Payer: Self-pay | Admitting: Podiatry

## 2022-08-01 ENCOUNTER — Encounter: Payer: Self-pay | Admitting: Internal Medicine

## 2022-08-01 ENCOUNTER — Ambulatory Visit (INDEPENDENT_AMBULATORY_CARE_PROVIDER_SITE_OTHER): Payer: BC Managed Care – PPO | Admitting: Internal Medicine

## 2022-08-01 VITALS — BP 124/72 | HR 103 | Ht 68.0 in | Wt 202.0 lb

## 2022-08-01 DIAGNOSIS — E1065 Type 1 diabetes mellitus with hyperglycemia: Secondary | ICD-10-CM | POA: Diagnosis not present

## 2022-08-01 DIAGNOSIS — E781 Pure hyperglyceridemia: Secondary | ICD-10-CM | POA: Diagnosis not present

## 2022-08-01 MED ORDER — DEXCOM G6 SENSOR MISC: 1.0000 | 3 refills | Status: DC

## 2022-08-01 MED ORDER — DEXCOM G6 TRANSMITTER MISC: 1.0000 | 3 refills | Status: DC

## 2022-08-01 MED ORDER — OMNIPOD 5 DEXG7G6 PODS GEN 5 MISC: 1.0000 | 3 refills | Status: DC

## 2022-08-01 NOTE — Patient Instructions (Signed)
Increase Semglee 16 units daily NovoLog 12 units with each meal Novolog correctional insulin: ADD extra units on insulin to your meal-time Novolog dose if your blood sugars are higher than 165. Use the scale below to help guide you:   Blood sugar before meal Number of units to inject  Less than 165 0 unit  166 -  200 1 units  201 -  235 2 units  236 -  270 3 units  271 -  305 4 units  306 -  340 5 units  341 -  375 6 units  376 -  410 7 units  411 -  445 8 units   Enter 50 grams of carbohydrates with the pump with each meal   HOW TO TREAT LOW BLOOD SUGARS (Blood sugar LESS THAN 70 MG/DL) Please follow the RULE OF 15 for the treatment of hypoglycemia treatment (when your (blood sugars are less than 70 mg/dL)   STEP 1: Take 15 grams of carbohydrates when your blood sugar is low, which includes:  3-4 GLUCOSE TABS  OR 3-4 OZ OF JUICE OR REGULAR SODA OR ONE TUBE OF GLUCOSE GEL    STEP 2: RECHECK blood sugar in 15 MINUTES STEP 3: If your blood sugar is still low at the 15 minute recheck --> then, go back to STEP 1 and treat AGAIN with another 15 grams of carbohydrates.

## 2022-08-01 NOTE — Telephone Encounter (Signed)
Susan [name], can you cancel her surgery? Thanks!

## 2022-08-01 NOTE — Progress Notes (Signed)
Name: Susan [name]  Age/ Sex: 22 y.o., female   MRN/ DOB: [id number], 2000-04-29     PCP: Pcp, No   Reason for Endocrinology Evaluation: Type 1 Diabetes Mellitus  Initial Endocrine Consultative Visit: 06/09/2020    PATIENT IDENTIFIER: Susan [name] is a 22 y.o. female with a past medical history of T1DM. The patient has followed with Endocrinology clinic since 06/09/2020 for consultative assistance with management of her diabetes.  DIABETIC HISTORY:  Susan [name] was diagnosed with DM in 2020. Was initially on insulin while in [place], per pt was taken off due to low A1c but restarted on insulin during hospitalization 05/2020. Insulin pump started 06/2020 . Her hemoglobin A1c has ranged from 10.1% in 2020, peaking at 10.3% in 2022.   On her initial visit she had an A1c of 10.3 % , we adjusted MDI regimen and was started on Omnipod in 06/2020     IDIOPATHIC HIRSUTISM: Testosterone, 17-OH progesterone, TFT's , DHEAS and 24-hr urinary cortisol have come back all normal 09/2020  SUBJECTIVE:   During the last visit (12/06/2021): A1c 11.5%      Today (08/01/2022): Susan [name] is here for a follow up on diabetes management.  The pt has NOT been to our clinic in 8 months.    She is accompanied by her grandfather  She checks her blood sugars multiple  times daily through CGM, but she has not been using it consistently   She presented to the ED with DKA in the setting of viral illness and steroid use 07/30/2022, she was discharged on Semglee and NovoLog Denies nausea, or vomiting  Denies constipation or diarrhea     She follows with podiatry for hammertoe 07/10/2022         HOME DIABETES REGIMEN:  Novolog  Semglee 12 units daily   Statin: no ACE-I/ARB: no Prior Diabetic Education: yes    CONTINUOUS GLUCOSE MONITORING RECORD INTERPRETATION    Dates of Recording: 5/30 - 08/01/2022  Sensor description:dexcom  Results statistics:   CGM use % of time 0  Average 281/53   Time in range  <1%  % Time Above 180 33  % Time above 250 66  % Time Below target 0     Glycemic patterns summary: Hyperglycemia noted during the day and night  Hyperglycemic episodes all day and night Hypoglycemic episodes occurred n/a Overnight periods: High       DIABETIC COMPLICATIONS: Microvascular complications:   Denies: CKD, neuropathy, retinopathy  Last Eye Exam: Completed 06/2020  Macrovascular complications:   Denies: CAD, CVA, PVD   HISTORY:  Past Medical History:  Past Medical History:  Diagnosis Date   Diabetes (HCC)    Past Surgical History: No past surgical history on file. Social History:  reports that she has never smoked. She has never used smokeless tobacco. She reports that she does not drink alcohol and does not use drugs. Family History:  Family History  Problem Relation Age of Onset   Diabetes Mother    Diabetes Father      HOME MEDICATIONS: Allergies as of 08/01/2022   No Known Allergies      Medication List        Accurate as of August 01, 2022 12:00 PM. If you have any questions, ask your nurse or doctor.          amoxicillin 500 MG capsule Commonly known as: AMOXIL Take 500 mg by mouth 2 (two) times daily.   cetirizine 10 MG tablet Commonly known as: ZYRTEC  Take 10 mg by mouth daily.   Dexcom G6 Sensor Misc 1 Device by Does not apply route as directed.   Dexcom G6 Transmitter Misc USE 1 DEVICE. DOES NOT APPLY ROUTE AS DIRECTED   insulin aspart 100 UNIT/ML FlexPen Commonly known as: NOVOLOG Inject 4 Units into the skin 3 (three) times daily with meals.   NovoLOG FlexPen 100 UNIT/ML FlexPen Generic drug: insulin aspart Inject 1-9 Units into the skin 3 times daily with meals. CBG 121 - 150: 1 unit-- CBG 151 - 200: 2 units-- CBG 201 - 250: 3 units-- CBG 251 - 300: 5 units-- CBG 301 - 350: 7 units-- CBG 351 - 400: 9 units   Insulin Pen Needle 30G X 5 MM Misc 1 Device by Does not apply route in the morning, at  noon, in the evening, and at bedtime.   TechLite Plus Pen Needles 32G X 4 MM Misc Generic drug: Insulin Pen Needle Use 4 times daily -  before meals and at bedtime.   promethazine 25 MG tablet Commonly known as: PHENERGAN Take 25 mg by mouth every 6 (six) hours as needed for nausea or vomiting.   Semglee (yfgn) 100 UNIT/ML Pen Generic drug: insulin glargine-yfgn Inject 12 Units into the skin daily.         OBJECTIVE:   Vital Signs: BP 124/72 (BP Location: Left Arm, Patient Position: Sitting, Cuff Size: Large)   Pulse (!) 103   Ht 5\' 8"  (1.727 m)   Wt 202 lb (91.6 kg)   SpO2 98%   BMI 30.71 kg/m   Wt Readings from Last 3 Encounters:  08/01/22 202 lb (91.6 kg)  07/31/22 200 lb 2.8 oz (90.8 kg)  12/06/21 195 lb (88.5 kg)     Exam: General: Pt appears well but drowsy  Lungs: Clear with good BS bilat   Heart: RRR   Abdomen: Soft, nontender  Extremities: No pretibial edema.  Neuro: MS is good with appropriate affect, pt is alert and Ox3    DM foot exam: 12/06/2021   The skin of the feet is intact without sores or ulcerations. The pedal pulses are 2+ on right and 2+ on left. The sensation is intact to a screening 5.07, 10 gram monofilament bilaterally    DATA REVIEWED:  Lab Results  Component Value Date   HGBA1C 11.9 (H) 07/30/2022   HGBA1C 6.7 (H) 06/19/2022   HGBA1C 11.5 (A) 12/06/2021     Latest Reference Range & Units 12/06/21 09:33  Sodium 135 - 145 mEq/L 129 (L)  Potassium 3.5 - 5.1 mEq/L 3.9  Chloride 96 - 112 mEq/L 94 (L)  CO2 19 - 32 mEq/L 25  Glucose 70 - 99 mg/dL 409 (H)  BUN 6 - 23 mg/dL 12  Creatinine 8.11 - 9.14 mg/dL 7.82  Calcium 8.4 - 95.6 mg/dL 9.6  GFR >21.30 mL/min 109.48    Latest Reference Range & Units 12/06/21 09:33  Total CHOL/HDL Ratio  4  Cholesterol 0 - 200 mg/dL 865  HDL Cholesterol >78.46 mg/dL 96.29  Direct LDL mg/dL 528.4  MICROALB/CREAT RATIO 0.0 - 30.0 mg/g 3.6  NonHDL  150.75  Triglycerides 0.0 - 149.0 mg/dL  132.4 (H)  VLDL 0.0 - 40.0 mg/dL 40.1 (H)    Latest Reference Range & Units 12/06/21 09:33  TSH 0.35 - 5.50 uIU/mL 3.91  T4,Free(Direct) 0.60 - 1.60 ng/dL 0.27    Latest Reference Range & Units 12/06/21 09:33  Creatinine,U mg/dL 253.6  Microalb, Ur 0.0 - 1.9 mg/dL 4.1 (H)  MICROALB/CREAT RATIO 0.0 - 30.0 mg/g 3.6      ASSESSMENT / PLAN / RECOMMENDATIONS:   1) Type 1 Diabetes Mellitus, poorly controlled, Without complications - Most recent A1c of 11.9%. Goal A1c < 7.0%.      -Patient continues with worsening glycemic control -She has been having difficulty obtaining her Dexcom and OmniPod from the pharmacy, it is unclear to me who is -She has resorted to giving herself NovoLog I did explain to the patient that she cannot just be on NovoLog, she should always be on a basal insulin when she is not on the pump as she will need our insulin coverage -She has been guesstimating on prandial dose of insulin, per grandfather's recommendations I will give her a standing dose of NovoLog with each meal, she was also advised to enter #50 grams with each meal when she receives the pump  -She was given #1 sensor Dexcom G6 -Refills were provided with a year supply -I will increase Semglee and NovoLog for now -She will be provided with a correction scale to use until the pump is on   MEDICATIONS: Increase Semglee 16 units daily Take NovoLog 12 units 3 times daily before every meal Start correction factor: NovoLog (BG-130/35)     Pump   Omnipod Settings   Insulin type   Novolog   Basal rate       0000  0.65 u/h    10000 0.65          I:C ratio       0000 1:12    Enter # 50 g TID              Sensitivity       0000  30       Goal       0000  100         EDUCATION / INSTRUCTIONS: BG monitoring instructions: Patient is instructed to check her blood sugars 4 times a day, before meals and bedtime  Call [facility] if: BG persistently < 70  I reviewed the  Rule of 15 for the treatment of hypoglycemia in detail with the patient. Literature supplied.   2) Diabetic complications:  Eye: Does not have known diabetic retinopathy.  Neuro/ Feet: Does not have known diabetic peripheral neuropathy .  Renal: Patient does not have known baseline CKD. She   is not on an ACEI/ARB at present.    3) Lipids: No indication for statin therapy .     4) Hypertriglyceridemia :  -Triglyceride elevated in the past, will continue to monitor   F/U in 3 months    Signed electronically by: Lyndle Herrlich, MD  [facility]  [facility] [address] [place], [place] [address] Phone: [phone] FAX: [phone]   CC: Pcp, No No address on file Phone: None  Fax: None  Return to Endocrinology clinic as below: Future Appointments  Date Time Provider Department Center  08/01/2022 12:10 PM Loletta Harper, Konrad Dolores, MD [facility] None  08/13/2022  9:15 AM Vivi Barrack, DPM [facility]  08/21/2022  9:45 AM Vivi Barrack, DPM [facility]  09/04/2022  9:15 AM Ardelle Anton Lesia Sago, DPM [facility]

## 2022-08-02 ENCOUNTER — Encounter: Payer: Self-pay | Admitting: Internal Medicine

## 2022-08-07 ENCOUNTER — Ambulatory Visit: Payer: BC Managed Care – PPO | Admitting: Nutrition

## 2022-08-08 ENCOUNTER — Encounter (HOSPITAL_BASED_OUTPATIENT_CLINIC_OR_DEPARTMENT_OTHER): Payer: Self-pay | Admitting: Family Medicine

## 2022-08-08 ENCOUNTER — Ambulatory Visit (INDEPENDENT_AMBULATORY_CARE_PROVIDER_SITE_OTHER): Payer: BC Managed Care – PPO | Admitting: Family Medicine

## 2022-08-08 VITALS — BP 105/66 | HR 85 | Ht 68.0 in | Wt 205.7 lb

## 2022-08-08 DIAGNOSIS — T782XXS Anaphylactic shock, unspecified, sequela: Secondary | ICD-10-CM

## 2022-08-08 DIAGNOSIS — Y9389 Activity, other specified: Secondary | ICD-10-CM

## 2022-08-08 DIAGNOSIS — Z794 Long term (current) use of insulin: Secondary | ICD-10-CM

## 2022-08-08 DIAGNOSIS — Z7689 Persons encountering health services in other specified circumstances: Secondary | ICD-10-CM | POA: Diagnosis not present

## 2022-08-08 DIAGNOSIS — E1065 Type 1 diabetes mellitus with hyperglycemia: Secondary | ICD-10-CM

## 2022-08-08 MED ORDER — EPINEPHRINE 0.3 MG/0.3ML IJ SOAJ
0.3000 mg | INTRAMUSCULAR | 1 refills | Status: AC | PRN
Start: 2022-08-08 — End: ?

## 2022-08-08 NOTE — Progress Notes (Unsigned)
New Patient Office Visit  Subjective    Patient ID: Susan [name], female    DOB: 2000/12/21  Age: 22 y.o. MRN: [id number]  HPI Susan [name] is a 22 year-old female who presents to establish care. She does not have any concerns at this time.  Former PCP: pediatrician Micah Flesher to [facility] for physical once  I reviewed the past medical history, family history, social history, surgical history, and allergies today.    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  Takes Zyrtec year-round   Attending school- [employer] for [employer]   DM1- followed by endocrinology- Dr Lonzo Cloud  Has recently had issues with her blood sugar in the mid 200s to 300s. Reports it has been better since she has not been sick.  Today fasting BGL in mid-100s    Outpatient Encounter Medications as of 08/08/2022  Medication Sig   cetirizine (ZYRTEC) 10 MG tablet Take 10 mg by mouth daily.   Continuous Glucose Sensor (DEXCOM G6 SENSOR) MISC 1 Device by Does not apply route as directed.   Continuous Glucose Transmitter (DEXCOM G6 TRANSMITTER) MISC 1 Device by Other route as directed.   insulin aspart (NOVOLOG) 100 UNIT/ML FlexPen Inject 4 Units into the skin 3 (three) times daily with meals.   insulin aspart (NOVOLOG) 100 UNIT/ML FlexPen Inject 1-9 Units into the skin 3 times daily with meals. CBG 121 - 150: 1 unit-- CBG 151 - 200: 2 units-- CBG 201 - 250: 3 units-- CBG 251 - 300: 5 units-- CBG 301 - 350: 7 units-- CBG 351 - 400: 9 units   Insulin Disposable Pump (OMNIPOD 5 G6 PODS, GEN 5,) MISC 1 Device by Does not apply route every other day.   Insulin Pen Needle 30G X 5 MM MISC 1 Device by Does not apply route in the morning, at noon, in the evening, and at bedtime.   Insulin Pen Needle 32G X 4 MM MISC  Use 4 times daily -  before meals and at bedtime.   promethazine (PHENERGAN) 25 MG tablet Take 25 mg by mouth every 6 (six) hours as needed for nausea or vomiting.   [DISCONTINUED] amoxicillin (AMOXIL) 500 MG capsule Take 500 mg by mouth 2 (two) times daily. (Patient not taking: Reported on 08/08/2022)   [DISCONTINUED] insulin glargine-yfgn (SEMGLEE) 100 UNIT/ML Pen Inject 12 Units into the skin daily. (Patient not taking: Reported on 08/08/2022)   No facility-administered encounter medications on file as of 08/08/2022.    Past Medical History:  Diagnosis Date   Diabetes (HCC)     History reviewed. No pertinent surgical history.  Family History  Problem Relation Age of Onset   Diabetes Mother    Diabetes Father    Breast cancer Maternal Grandmother    Pancreatic cancer Maternal Grandmother    Colon cancer Neg Hx     Review of Systems  Constitutional:  Negative for malaise/fatigue.  Eyes:  Negative for blurred vision and double vision.  Respiratory:  Negative for cough and shortness of breath.   Cardiovascular:  Negative for chest pain and palpitations.  Gastrointestinal:  Negative for abdominal pain, nausea and vomiting.  Genitourinary:  Negative for frequency and urgency.  Musculoskeletal:  Negative for myalgias.  Neurological:  Negative for dizziness, weakness and headaches.  Endo/Heme/Allergies:  Negative for  polydipsia.  Psychiatric/Behavioral:  Negative for depression and suicidal ideas. The patient is not nervous/anxious.    Objective    BP 105/66   Pulse 85   Ht 5\' 8"  (1.727 m)   Wt 205 lb 11.2 oz (93.3 kg)   SpO2 100%   BMI 31.28 kg/m   Physical Exam Constitutional:      Appearance: Normal appearance.  Cardiovascular:     Rate and Rhythm: Normal rate and regular rhythm.     Pulses: Normal pulses.     Heart sounds: Normal heart sounds.  Pulmonary:     Effort: Pulmonary effort is normal.     Breath sounds: Normal breath sounds.  Neurological:     Mental  Status: She is alert.  Psychiatric:        Mood and Affect: Mood normal.        Behavior: Behavior normal.        Thought Content: Thought content normal.        Judgment: Judgment normal.    Assessment & Plan:   1. Encounter to establish care Patient presents today to establish care with new primary care provider. Review of past medical history, allergies, social history, and family history.   2. Type 1 diabetes mellitus with hyperglycemia (HCC) Patient has a history of type 1 diabetes mellitus. She is currently followed by endocrinology and has been since . She was seen recently at [facility] ED for elevated blood glucose readings- was diagnosed with DKA.    Return in about 3 months (around 11/08/2022) for Physical with fasting labs.   Alyson Reedy, FNP

## 2022-08-09 DIAGNOSIS — T782XXA Anaphylactic shock, unspecified, initial encounter: Secondary | ICD-10-CM | POA: Insufficient documentation

## 2022-08-13 ENCOUNTER — Encounter: Payer: BC Managed Care – PPO | Admitting: Podiatry

## 2022-08-18 ENCOUNTER — Encounter: Payer: Self-pay | Admitting: Internal Medicine

## 2022-08-20 ENCOUNTER — Telehealth: Payer: Self-pay

## 2022-08-20 ENCOUNTER — Other Ambulatory Visit: Payer: Self-pay | Admitting: Internal Medicine

## 2022-08-20 DIAGNOSIS — E1065 Type 1 diabetes mellitus with hyperglycemia: Secondary | ICD-10-CM

## 2022-08-20 MED ORDER — OMNIPOD 5 DEXG7G6 PODS GEN 5 MISC
1.0000 | 3 refills | Status: DC
Start: 2022-08-20 — End: 2022-08-20

## 2022-08-20 NOTE — Telephone Encounter (Signed)
Susan [name] Ann [name], CMA  ?

## 2022-08-21 ENCOUNTER — Encounter: Payer: BC Managed Care – PPO | Admitting: Podiatry

## 2022-09-04 ENCOUNTER — Encounter: Payer: BC Managed Care – PPO | Admitting: Podiatry

## 2022-09-25 ENCOUNTER — Encounter: Payer: Self-pay | Admitting: Internal Medicine

## 2022-09-25 ENCOUNTER — Other Ambulatory Visit: Payer: Self-pay

## 2022-09-25 DIAGNOSIS — E1065 Type 1 diabetes mellitus with hyperglycemia: Secondary | ICD-10-CM

## 2022-09-25 MED ORDER — OMNIPOD 5 DEXG7G6 PODS GEN 5 MISC
1 refills | Status: DC
Start: 2022-09-25 — End: 2023-01-24

## 2022-09-25 NOTE — Telephone Encounter (Signed)
Patient needs PA on Omnipod pods.

## 2022-10-01 ENCOUNTER — Other Ambulatory Visit (HOSPITAL_COMMUNITY): Payer: Self-pay

## 2022-10-01 ENCOUNTER — Telehealth: Payer: Self-pay

## 2022-10-01 NOTE — Telephone Encounter (Signed)
Pharmacy Patient Advocate Encounter   Received notification from RX Request Messages that prior authorization for Omnipods is required/requested.   Insurance verification completed.   The patient is insured through [facility] .   Per test claim: PA required; PA started via CoverMyMeds. KEY [id number] . Waiting for clinical questions to populate.

## 2022-10-08 ENCOUNTER — Other Ambulatory Visit (HOSPITAL_COMMUNITY): Payer: Self-pay

## 2022-10-11 NOTE — Telephone Encounter (Signed)
Received notification from insurance that PA was cancelled due to: MEMBER NOT FOUND

## 2022-10-15 ENCOUNTER — Other Ambulatory Visit (HOSPITAL_COMMUNITY): Payer: Self-pay

## 2022-10-15 NOTE — Telephone Encounter (Addendum)
Still saying member not found.  Called the pharmacy to confirm pt's ins and address. They have been billing pt's Medicaid for Omnipods and they are paying without pa.

## 2022-10-15 NOTE — Telephone Encounter (Addendum)
New PA created. Key:  [id number]  PA required; PA started via CoverMyMeds. KEY  [id number] . Waiting for clinical questions to populate.

## 2022-11-09 ENCOUNTER — Encounter (HOSPITAL_BASED_OUTPATIENT_CLINIC_OR_DEPARTMENT_OTHER): Payer: Self-pay | Admitting: Family Medicine

## 2022-11-09 ENCOUNTER — Ambulatory Visit (HOSPITAL_BASED_OUTPATIENT_CLINIC_OR_DEPARTMENT_OTHER): Payer: BC Managed Care – PPO | Admitting: Family Medicine

## 2022-11-09 VITALS — BP 117/79 | HR 84 | Ht 68.0 in | Wt 217.0 lb

## 2022-11-09 DIAGNOSIS — Z23 Encounter for immunization: Secondary | ICD-10-CM

## 2022-11-09 DIAGNOSIS — Z0000 Encounter for general adult medical examination without abnormal findings: Secondary | ICD-10-CM | POA: Insufficient documentation

## 2022-11-09 NOTE — Progress Notes (Signed)
Complete physical exam  Patient: Susan [name]   DOB: 2000/07/13   22 y.o. Female  MRN: [id number]  Subjective:    Susan [name] is a 22 y.o. female who presents today for a complete physical exam. She reports consuming a general diet. Gym/ health club routine includes cardio, mod to heavy weightlifting, and she goes about three times weekly. She generally feels well. She reports sleeping well. She does not have additional problems to discuss today.   HEALTH SCREENINGS: - Vision Screening: Dec 2023 - Dental Visits: Dec 2023 - Pap smear: up to date - Breast Exam: discussed self breast exams - STD Screening: Declined - Mammogram (40+): Not applicable  - Colonoscopy (45+): Not applicable  - Bone Density (65+ or under 65 with predisposing conditions): Not applicable  - Lung CA screening with low-dose CT:  Not applicable  IMMUNIZATIONS: - Tdap: Tetanus vaccination status reviewed: declines at this time - HPV: unsure if she received it, plans to update [place] with prior peds office - Influenza: Administered today - Pneumovax: Not applicable - Prevnar 20: Not applicable - Zostavax (50+): Not applicable    Depression screenings:    11/09/2022   10:57 AM 08/08/2022    1:42 PM 06/10/2020    1:17 PM  Depression screen PHQ 2/9  Decreased Interest 1 1 0  Down, Depressed, Hopeless 0 1 0  PHQ - 2 Score 1 2 0  Altered sleeping 1 0   Tired, decreased energy 1 1   Change in appetite 0 0   Feeling bad or failure about yourself  0    Trouble concentrating 0 0   Moving slowly or fidgety/restless 0 0   Suicidal thoughts 0 0   PHQ-9 Score 3 3   Difficult doing work/chores Not difficult at all Not difficult at all     Anxiety screenings:    11/09/2022   10:58 AM 08/08/2022    1:43 PM  GAD 7 : Generalized Anxiety Score  Nervous, Anxious, on Edge 1 0  Control/stop worrying 0 0  Worry too much - different things 0 0  Trouble relaxing 0 0  Restless 0 0  Easily annoyed or irritable 0 0   Afraid - awful might happen 0 0  Total GAD 7 Score 1 0  Anxiety Difficulty Not difficult at all Not difficult at all   Patient Care Team: Alyson Reedy, FNP as PCP - General (Family Medicine)   Outpatient Medications Prior to Visit  Medication Sig   cetirizine (ZYRTEC) 10 MG tablet Take 10 mg by mouth daily.   Continuous Glucose Sensor (DEXCOM G6 SENSOR) MISC 1 Device by Does not apply route as directed.   Continuous Glucose Transmitter (DEXCOM G6 TRANSMITTER) MISC 1 Device by Other route as directed.   EPINEPHrine 0.3 mg/0.3 mL IJ SOAJ injection Inject 0.3 mg into the muscle as needed for anaphylaxis.   insulin aspart (NOVOLOG) 100 UNIT/ML FlexPen Inject 4 Units into the skin 3 (three) times daily with meals.   insulin aspart (NOVOLOG) 100 UNIT/ML FlexPen Inject 1-9 Units into the skin 3 times daily with meals. CBG 121 - 150: 1 unit-- CBG 151 - 200: 2 units-- CBG 201 - 250: 3 units-- CBG 251 - 300: 5 units-- CBG 301 - 350: 7 units-- CBG 351 - 400: 9 units   Insulin Disposable Pump (OMNIPOD 5 G6 PODS, GEN 5,) MISC USE EVERY 3 DAYS   Insulin Pen Needle 30G X 5 MM MISC 1 Device by Does not apply route  in the morning, at noon, in the evening, and at bedtime.   Insulin Pen Needle 32G X 4 MM MISC Use 4 times daily -  before meals and at bedtime.   promethazine (PHENERGAN) 25 MG tablet Take 25 mg by mouth every 6 (six) hours as needed for nausea or vomiting.   No facility-administered medications prior to visit.    Review of Systems  Constitutional:  Negative for malaise/fatigue.  HENT:  Negative for congestion.   Eyes:  Negative for blurred vision and double vision.  Respiratory:  Negative for cough and shortness of breath.   Cardiovascular:  Negative for chest pain, palpitations and leg swelling.  Gastrointestinal:  Negative for abdominal pain, nausea and vomiting.  Genitourinary:  Negative for frequency and urgency.  Musculoskeletal:  Negative for myalgias.  Neurological:  Negative  for dizziness, weakness and headaches.  Psychiatric/Behavioral:  Negative for depression, substance abuse and suicidal ideas. The patient is not nervous/anxious and does not have insomnia.        Objective:     BP 117/79   Pulse 84   Ht 5\' 8"  (1.727 m)   Wt 217 lb (98.4 kg)   LMP 11/05/2022 (Exact Date)   SpO2 99%   BMI 32.99 kg/m  BP Readings from Last 3 Encounters:  11/09/22 117/79  08/08/22 105/66  08/01/22 124/72     Physical Exam Vitals reviewed.  Constitutional:      Appearance: Normal appearance.  HENT:     Head: Normocephalic.     Right Ear: Tympanic membrane, ear canal and external ear normal.     Left Ear: Tympanic membrane, ear canal and external ear normal.     Nose: Nose normal.     Mouth/Throat:     Mouth: Mucous membranes are moist.     Pharynx: Oropharynx is clear.  Eyes:     Extraocular Movements: Extraocular movements intact.     Pupils: Pupils are equal, round, and reactive to light.  Cardiovascular:     Rate and Rhythm: Normal rate and regular rhythm.     Pulses: Normal pulses.     Heart sounds: Normal heart sounds.  Pulmonary:     Effort: Pulmonary effort is normal.     Breath sounds: Normal breath sounds.  Abdominal:     General: Abdomen is flat. Bowel sounds are normal.     Palpations: Abdomen is soft.  Musculoskeletal:        General: Normal range of motion.  Skin:    General: Skin is warm and dry.  Neurological:     Mental Status: She is alert.  Psychiatric:        Mood and Affect: Mood normal.        Behavior: Behavior normal.        Thought Content: Thought content normal.        Judgment: Judgment normal.        Assessment & Plan:    Routine Health Maintenance and Physical Exam  Health Maintenance  Topic Date Due   HPV Vaccine (1 - 3-dose series) Never done   DTaP/Tdap/Td vaccine (5 - Td or Tdap) 12/01/2021   Yearly kidney health urinalysis for diabetes  12/07/2022   COVID-19 Vaccine (4 - 2023-24 season) 11/25/2022*    Hepatitis C Screening  11/09/2023*   Eye exam for diabetics  01/04/2023   Hemoglobin A1C  01/29/2023   Complete foot exam   07/10/2023   Yearly kidney function blood test for diabetes  07/31/2023  Pap Smear  12/31/2024   Flu Shot  Completed   HIV Screening  Completed  *Topic was postponed. The date shown is not the original due date.    Wellness examination Assessment & Plan: Routine HCM labs ordered today. Will obtain labs today and update patient with results.  Review of PMH, FH, SH, medications and HM performed. Preventative care hand-out provided.  Recommend healthy diet.  Recommend approximately 150 minutes/week of moderate intensity exercise. Recommend regular dental and vision exams. Always use seatbelt/lap and shoulder restraints. Recommend using smoke alarms and checking batteries at least twice a year. Recommend using sunscreen when outside. Discussed immunization recommendations for tetanus vaccine. Patient declines at this time, will consider. Patient agreeable to influenza today.   Orders: -     Comprehensive metabolic panel -     Lipid panel  Encounter for immunization -     Flu vaccine trivalent PF, 6mos and older(Flulaval,Afluria,Fluarix,Fluzone)     Return in about 1 year (around 11/09/2023) for Physical.     Alyson Reedy, FNP

## 2022-11-09 NOTE — Assessment & Plan Note (Signed)
Routine HCM labs ordered today. Will obtain labs today and update patient with results.  Review of PMH, FH, SH, medications and HM performed. Preventative care hand-out provided.  Recommend healthy diet.  Recommend approximately 150 minutes/week of moderate intensity exercise. Recommend regular dental and vision exams. Always use seatbelt/lap and shoulder restraints. Recommend using smoke alarms and checking batteries at least twice a year. Recommend using sunscreen when outside. Discussed immunization recommendations for tetanus vaccine. Patient declines at this time, will consider. Patient agreeable to influenza today.

## 2022-11-09 NOTE — Patient Instructions (Signed)
MyChart:  For all urgent or time sensitive needs we ask that you please call the office to avoid delays. Our number is [phone]. MyChart is not constantly monitored and due to the large volume of messages a day, replies may take up to 72 business hours.   MyChart Policy: MyChart allows for you to see your visit notes, after visit summary, provider recommendations, lab and tests results, make an appointment, request refills, and contact your provider or the office for non-urgent questions or concerns. Providers are seeing patients during normal business hours and do not have built in time to review MyChart messages.  We ask that you allow a minimum of 3 business days for responses to [employer]. For this reason, please do not send urgent requests through MyChart. Please call the office at [phone]. New and ongoing conditions may require a visit. We have virtual and in person visit available for your convenience.  Complex MyChart concerns may require a visit. Your provider may request you schedule a virtual or in person visit to ensure we are providing the best care possible. MyChart messages sent after 11:00 AM on Friday will not be received by the provider until Monday morning.    Lab and Test Results: You will receive your lab and test results on MyChart as soon as they are completed and results have been sent by the lab or testing facility. Due to this service, you will receive your results BEFORE your provider.  I review lab and tests results each morning prior to seeing patients. Some results require collaboration with other providers to ensure you are receiving the most appropriate care. For this reason, we ask that you please allow a minimum of 3-5 business days from the time the ALL results have been received for your provider to receive and review lab and test results and contact you about these.  Most lab and test result comments from the provider will be sent through MyChart.  Your provider may recommend changes to the plan of care, follow-up visits, repeat testing, ask questions, or request an office visit to discuss these results. You may reply directly to this message or call the office at [phone] to provide information for the provider or set up an appointment. In some instances, you will be called with test results and recommendations. Please let us know if this is preferred and we will make note of this in your chart to provide this for you.    If you have not heard a response to your lab or test results in 5 business days from all results returning to MyChart, please call the office to let us know. We ask that you please avoid calling prior to this time unless there is an emergent concern. Due to high call volumes, this can delay the resulting process.   After Hours: For all non-emergency after hours needs, please call the office at [phone] and select the option to reach the on-call provider service. On-call services are shared between multiple [facility] offices and therefore it will not be possible to speak directly with your provider. On-call providers may provide medical advice and recommendations, but are unable to provide refills for maintenance medications.  For all emergency or urgent medical needs after normal business hours, we recommend that you seek care at the closest Urgent Care or Emergency Department to ensure appropriate treatment in a timely manner.  [facility] has a 24 hour emergency room located on the ground floor for your  convenience.    Urgent Concerns During the Business Day Providers are seeing patients from 8AM to 5PM, Monday through Thursday, and 8AM to 12PM on Friday with a busy schedule and are most often not able to respond to non-urgent calls until the end of the day or the next business day. If you should have URGENT concerns during the day, please call and speak to the nurse or schedule a same day  appointment so that we can address your concern without delay.    Thank you, again, for choosing me as your health care partner. I appreciate your trust and look forward to learning more about you.    Alyson Reedy, FNP-C

## 2022-11-10 LAB — COMPREHENSIVE METABOLIC PANEL
ALT: 18 IU/L (ref 0–32)
AST: 30 IU/L (ref 0–40)
Albumin: 4.3 g/dL (ref 4.0–5.0)
Alkaline Phosphatase: 79 IU/L (ref 44–121)
BUN/Creatinine Ratio: 8 — ABNORMAL LOW (ref 9–23)
BUN: 6 mg/dL (ref 6–20)
Bilirubin Total: 0.3 mg/dL (ref 0.0–1.2)
CO2: 25 mmol/L (ref 20–29)
Calcium: 9.9 mg/dL (ref 8.7–10.2)
Chloride: 101 mmol/L (ref 96–106)
Creatinine, Ser: 0.8 mg/dL (ref 0.57–1.00)
Globulin, Total: 3.5 g/dL (ref 1.5–4.5)
Glucose: 159 mg/dL — ABNORMAL HIGH (ref 70–99)
Potassium: 4.9 mmol/L (ref 3.5–5.2)
Sodium: 138 mmol/L (ref 134–144)
Total Protein: 7.8 g/dL (ref 6.0–8.5)
eGFR: 106 mL/min/{1.73_m2} (ref >59)

## 2022-11-10 LAB — LIPID PANEL
Chol/HDL Ratio: 3.4 ratio (ref 0.0–4.4)
Cholesterol, Total: 185 mg/dL (ref 100–199)
HDL: 54 mg/dL (ref >39)
LDL Chol Calc (NIH): 104 mg/dL — ABNORMAL HIGH (ref 0–99)
Triglycerides: 152 mg/dL — ABNORMAL HIGH (ref 0–149)
VLDL Cholesterol Cal: 27 mg/dL (ref 5–40)

## 2022-11-27 ENCOUNTER — Encounter: Payer: Self-pay | Admitting: Internal Medicine

## 2022-11-28 ENCOUNTER — Telehealth: Payer: Self-pay

## 2022-11-28 ENCOUNTER — Other Ambulatory Visit (HOSPITAL_COMMUNITY): Payer: Self-pay

## 2022-11-28 NOTE — Telephone Encounter (Signed)
PA needed on Dexcom

## 2022-11-28 NOTE — Telephone Encounter (Signed)
Pharmacy Patient Advocate Encounter   Received notification from Pt Calls Messages that prior authorization for Dexcom g6 sensor is required/requested.   Insurance verification completed.   The patient is insured through [employer] and [employer].   Per test claim: The current 30 day co-pay is, $0.  No PA needed at this time. This test claim was processed through [facility]- copay amounts may vary at other pharmacies due to pharmacy/plan contracts, or as the patient moves through the different stages of their insurance plan.

## 2023-01-01 DIAGNOSIS — L680 Hirsutism: Secondary | ICD-10-CM | POA: Diagnosis not present

## 2023-01-10 ENCOUNTER — Encounter (HOSPITAL_BASED_OUTPATIENT_CLINIC_OR_DEPARTMENT_OTHER): Payer: Self-pay | Admitting: Family Medicine

## 2023-01-23 ENCOUNTER — Encounter (HOSPITAL_BASED_OUTPATIENT_CLINIC_OR_DEPARTMENT_OTHER): Payer: Self-pay | Admitting: Family Medicine

## 2023-01-23 ENCOUNTER — Ambulatory Visit (HOSPITAL_BASED_OUTPATIENT_CLINIC_OR_DEPARTMENT_OTHER): Payer: BC Managed Care – PPO | Admitting: Family Medicine

## 2023-01-23 VITALS — BP 129/84 | HR 98 | Ht 68.0 in | Wt 221.4 lb

## 2023-01-23 DIAGNOSIS — Z30011 Encounter for initial prescription of contraceptive pills: Secondary | ICD-10-CM | POA: Diagnosis not present

## 2023-01-23 LAB — POCT URINE PREGNANCY: Preg Test, Ur: NEGATIVE

## 2023-01-23 MED ORDER — NORETHINDRONE 0.35 MG PO TABS: 1.0000 | ORAL_TABLET | Freq: Every day | ORAL | 11 refills | Status: AC

## 2023-01-23 NOTE — Progress Notes (Signed)
   Established Patient Office Visit  Subjective   Patient ID: Susan [name], female    DOB: 21-Oct-2000  Age: 22 y.o. MRN: [id number]  Chief Complaint  Patient presents with   Contraception   Susan [name] is a 22 year old female patient who presents for discussion of oral contraception. She is interested in progesterone only pills (POPs). She does not have a history of hypertension, blood clots, migraines, or cigarette smoking- she just expresses interest in POPs.   Review of Systems  Constitutional:  Negative for malaise/fatigue and weight loss.  Eyes:  Negative for blurred vision and double vision.  Respiratory:  Negative for cough and shortness of breath.   Cardiovascular:  Negative for chest pain, palpitations, leg swelling and PND.  Gastrointestinal:  Negative for abdominal pain, diarrhea, nausea and vomiting.  Neurological:  Negative for dizziness, speech change, focal weakness and headaches.  Psychiatric/Behavioral:  Negative for depression and suicidal ideas. The patient is not nervous/anxious and does not have insomnia.     Objective:     BP 129/84   Pulse 98   Ht 5\' 8"  (1.727 m)   Wt 221 lb 6.4 oz (100.4 kg)   LMP 01/01/2023 (Exact Date)   SpO2 99%   BMI 33.66 kg/m  BP Readings from Last 3 Encounters:  01/24/23 118/74  01/23/23 129/84  11/09/22 117/79     Physical Exam Vitals reviewed.  Constitutional:      Appearance: Normal appearance.  Cardiovascular:     Rate and Rhythm: Normal rate and regular rhythm.     Pulses: Normal pulses.     Heart sounds: Normal heart sounds.  Pulmonary:     Effort: Pulmonary effort is normal.     Breath sounds: Normal breath sounds.  Neurological:     Mental Status: She is alert.  Psychiatric:        Mood and Affect: Mood normal.        Behavior: Behavior normal.     Assessment & Plan:   1. Initiation of oral contraception Patient is a pleasant 22 year old female patient who presents for discussion of contraception. She  is interested in oral contraception. Discussed how to take medication, additional barrier methods, and possible side effects. Advised patient to contact office if she develops abdominal pain, headaches, lower leg pain, unusual/heavy bleeding, or dizziness. Order sent for norethindrone.  - POCT urine pregnancy - norethindrone (MICRONOR) 0.35 MG tablet; Take 1 tablet (0.35 mg total) by mouth daily.  Dispense: 28 tablet; Refill: 11  Return if symptoms worsen or fail to improve.   Alyson Reedy, FNP

## 2023-01-24 ENCOUNTER — Ambulatory Visit (INDEPENDENT_AMBULATORY_CARE_PROVIDER_SITE_OTHER): Payer: BC Managed Care – PPO | Admitting: Internal Medicine

## 2023-01-24 ENCOUNTER — Encounter: Payer: Self-pay | Admitting: Internal Medicine

## 2023-01-24 ENCOUNTER — Other Ambulatory Visit: Payer: Self-pay

## 2023-01-24 VITALS — BP 118/74 | HR 100 | Ht 68.0 in | Wt 221.0 lb

## 2023-01-24 DIAGNOSIS — E1065 Type 1 diabetes mellitus with hyperglycemia: Secondary | ICD-10-CM

## 2023-01-24 LAB — POCT GLYCOSYLATED HEMOGLOBIN (HGB A1C): Hemoglobin A1C: 7.6 % — AB (ref 4.0–5.6)

## 2023-01-24 MED ORDER — OMNIPOD 5 DEXG7G6 PODS GEN 5 MISC: 1.0000 | 3 refills | Status: DC

## 2023-01-24 MED ORDER — DEXCOM G7 SENSOR MISC: 1.0000 | 3 refills | Status: DC

## 2023-01-24 MED ORDER — INSULIN ASPART 100 UNIT/ML IJ SOLN: INTRAMUSCULAR | 3 refills | Status: DC

## 2023-01-24 NOTE — Patient Instructions (Signed)

## 2023-01-24 NOTE — Progress Notes (Signed)
Name: Susan [name]  Age/ Sex: 22 y.o., female   MRN/ DOB: [id number], April 18, 2000     PCP: Alyson Reedy, FNP   Reason for Endocrinology Evaluation: Type 1 Diabetes Mellitus  Initial Endocrine Consultative Visit: 06/09/2020    PATIENT IDENTIFIER: Susan [name] is a 22 y.o. female with a past medical history of T1DM. The patient has followed with Endocrinology clinic since 06/09/2020 for consultative assistance with management of her diabetes.  DIABETIC HISTORY:  Susan [name] was diagnosed with DM in 2020. Was initially on insulin while in [place], per pt was taken off due to low A1c but restarted on insulin during hospitalization 05/2020. Insulin pump started 06/2020 . Her hemoglobin A1c has ranged from 10.1% in 2020, peaking at 10.3% in 2022.   On her initial visit she had an A1c of 10.3 % , we adjusted MDI regimen and was started on Omnipod in 06/2020     IDIOPATHIC HIRSUTISM: Testosterone, 17-OH progesterone, TFT's , DHEAS and 24-hr urinary cortisol have come back all normal 09/2020  SUBJECTIVE:   During the last visit (08/01/2022): A1c 11.9%      Today (01/24/2023): Susan [name] is here for a follow up on diabetes management.  She checks her blood sugars multiple  times daily through CGM.  The patient has been noted with hypoglycemia. She is symptomatic .   Denies nausea, or vomiting  Denies constipation or diarrhea     She follows with podiatry for hammertoe 07/10/2022    This patient with type 1 diabetes is treated with OmniPod (insulin pump). During the visit the pump basal and bolus doses were reviewed including carb/insulin rations and supplemental doses. The clinical list was updated. The glucose meter download was reviewed in detail to determine if the current pump settings are providing the best glycemic control without excessive hypoglycemia.  Pump and meter download:    Pump   OmniPod Settings   Insulin type   NovoLog   Basal rate       0000 0.75               I:C ratio       0000 1:15                  Sensitivity       0000  50      Goal       0000  120            Type & Model of Pump: OmniPod Insulin Type: Currently using NovoLog.  Body mass index is 33.6 kg/m.  PUMP STATISTICS: Average BG: 138  Average Daily Carbs (g): 169.4  Average Total Daily Insulin: 17.6  Average Daily Basal: 9.3 (53 %) Average Daily Bolus: 8.3 (47 %)   HOME DIABETES REGIMEN:  Novolog  Statin: no ACE-I/ARB: no Prior Diabetic Education: yes    CONTINUOUS GLUCOSE MONITORING RECORD INTERPRETATION    Dates of Recording: 11/22-12/06/2022  Sensor description:dexcom  Results statistics:   CGM use % of time 46.6  Average 138/36  Time in range 86%  % Time Above 180 13  % Time above 250 0  % Time Below target 1     Glycemic patterns summary: BGs are optimal overnight and throughout the day  Hyperglycemic episodes rare-postprandial Hypoglycemic episodes occurred at variable times Overnight periods: Optimal      DIABETIC COMPLICATIONS: Microvascular complications:   Denies: CKD, neuropathy, retinopathy  Last Eye Exam: Completed 06/2020  Macrovascular complications:   Denies: CAD, CVA, PVD  HISTORY:  Past Medical History:  Past Medical History:  Diagnosis Date   Diabetes (HCC)    DKA (diabetic ketoacidosis) (HCC) 04/18/2020   DKA, type 1 (HCC) 12/03/2018   Past Surgical History: No past surgical history on file. Social History:  reports that she has never smoked. She has never used smokeless tobacco. She reports that she does not currently use alcohol. She reports that she does not use drugs. Family History:  Family History  Problem Relation Age of Onset   Diabetes Mother    Diabetes Father    Breast cancer Maternal Grandmother    Pancreatic cancer Maternal Grandmother    Colon cancer Neg Hx    Uterine cancer Neg Hx    Ovarian cancer Neg Hx      HOME MEDICATIONS: Allergies as of 01/24/2023   No Known  Allergies      Medication List        Accurate as of January 24, 2023  7:22 AM. If you have any questions, ask your nurse or doctor.          cetirizine 10 MG tablet Commonly known as: ZYRTEC Take 10 mg by mouth daily.   Dexcom G6 Sensor Misc 1 Device by Does not apply route as directed.   Dexcom G6 Transmitter Misc 1 Device by Other route as directed.   EPINEPHrine 0.3 mg/0.3 mL Soaj injection Commonly known as: EPI-PEN Inject 0.3 mg into the muscle as needed for anaphylaxis.   insulin aspart 100 UNIT/ML FlexPen Commonly known as: NOVOLOG Inject 4 Units into the skin 3 (three) times daily with meals.   NovoLOG FlexPen 100 UNIT/ML FlexPen Generic drug: insulin aspart Inject 1-9 Units into the skin 3 times daily with meals. CBG 121 - 150: 1 unit-- CBG 151 - 200: 2 units-- CBG 201 - 250: 3 units-- CBG 251 - 300: 5 units-- CBG 301 - 350: 7 units-- CBG 351 - 400: 9 units   Insulin Pen Needle 30G X 5 MM Misc 1 Device by Does not apply route in the morning, at noon, in the evening, and at bedtime.   TechLite Plus Pen Needles 32G X 4 MM Misc Generic drug: Insulin Pen Needle Use 4 times daily -  before meals and at bedtime.   norethindrone 0.35 MG tablet Commonly known as: MICRONOR Take 1 tablet (0.35 mg total) by mouth daily.   Omnipod 5 DexG7G6 Pods Gen 5 Misc USE EVERY 3 DAYS   promethazine 25 MG tablet Commonly known as: PHENERGAN Take 25 mg by mouth every 6 (six) hours as needed for nausea or vomiting.         OBJECTIVE:   Vital Signs: BP 118/74 (BP Location: Left Arm, Patient Position: Sitting, Cuff Size: Large)   Pulse 100   Ht 5\' 8"  (1.727 m)   Wt 221 lb (100.2 kg)   LMP 01/01/2023 (Exact Date)   SpO2 99%   BMI 33.60 kg/m   Wt Readings from Last 3 Encounters:  01/23/23 221 lb 6.4 oz (100.4 kg)  11/09/22 217 lb (98.4 kg)  08/08/22 205 lb 11.2 oz (93.3 kg)     Exam: General: Pt appears well but drowsy  Lungs: Clear with good BS bilat    Heart: RRR   Abdomen: Soft, nontender  Extremities: No pretibial edema.  Neuro: MS is good with appropriate affect, pt is alert and Ox3    DM foot exam: 07/10/2022 per podiatry     DATA REVIEWED:  Lab Results  Component Value  Date   HGBA1C 11.9 (H) 07/30/2022   HGBA1C 6.7 (H) 06/19/2022   HGBA1C 11.5 (A) 12/06/2021    Latest Reference Range & Units 01/24/23 11:21  Microalb, Ur mg/dL 1.7  MICROALB/CREAT RATIO <30 mg/g creat 14  Creatinine, Urine 20 - 275 mg/dL 191     Latest Reference Range & Units 11/09/22 11:09  Sodium 134 - 144 mmol/L 138  Potassium 3.5 - 5.2 mmol/L 4.9  Chloride 96 - 106 mmol/L 101  CO2 20 - 29 mmol/L 25  Glucose 70 - 99 mg/dL 478 (H)  BUN 6 - 20 mg/dL 6  Creatinine 2.95 - 6.21 mg/dL 3.08  Calcium 8.7 - 65.7 mg/dL 9.9  BUN/Creatinine Ratio 9 - 23  8 (L)  eGFR >59 mL/min/1.73 106  Alkaline Phosphatase 44 - 121 IU/L 79  Albumin 4.0 - 5.0 g/dL 4.3  AST 0 - 40 IU/L 30  ALT 0 - 32 IU/L 18  Total Protein 6.0 - 8.5 g/dL 7.8  Total Bilirubin 0.0 - 1.2 mg/dL 0.3  Total CHOL/HDL Ratio 0.0 - 4.4 ratio 3.4  Cholesterol, Total 100 - 199 mg/dL 846  HDL Cholesterol >96 mg/dL 54  Triglycerides 0 - 295 mg/dL 284 (H)  VLDL Cholesterol Cal 5 - 40 mg/dL 27  LDL Chol Calc (NIH) 0 - 99 mg/dL 132 (H)  (H): Data is abnormally high (L): Data is abnormally low  ASSESSMENT / PLAN / RECOMMENDATIONS:   1) Type 1 Diabetes Mellitus, improving glycemic control, Without complications - Most recent A1c of 7.6%. Goal A1c < 7.0%.     -A1c has trended down from 11.9% to 7.6% -I will decrease her basal rate as below due to hypoglycemia, and will adjust insulin to carb ratio as she is has been noted with postprandial hyperglycemia at times -I have upgraded her to OmniPod G7 and Dexcom G7  MEDICATIONS: NovoLog    Pump   Omnipod Settings   Insulin type   Novolog   Basal rate       0000 0.70              I:C ratio       0000 1:14                  Sensitivity        0000  50      Goal       0000  120         EDUCATION / INSTRUCTIONS: BG monitoring instructions: Patient is instructed to check her blood sugars 4 times a day, before meals and bedtime  Call [facility] if: BG persistently < 70  I reviewed the Rule of 15 for the treatment of hypoglycemia in detail with the patient. Literature supplied.   2) Diabetic complications:  Eye: Does not have known diabetic retinopathy.  Neuro/ Feet: Does not have known diabetic peripheral neuropathy .  Renal: Patient does not have known baseline CKD. She   is not on an ACEI/ARB at present.    3) Lipids: No indication for statin therapy .     4) Hypertriglyceridemia :  -Triglyceride elevated in the past, will continue to monitor   F/U in 4 months    Signed electronically by: Lyndle Herrlich, MD  [facility] Endocrinology  [facility] [address] [place], [place] [address] Phone: [phone] FAX: [phone]   CC: Alyson Reedy, FNP [address] [place] [address] Phone: [phone]  Fax: [phone]  Return to Endocrinology clinic as below: Future Appointments  Date Time Provider Department Center  01/24/2023 10:50 AM Shanikia Kernodle, Konrad Dolores, MD [facility] None  11/12/2023  8:10 AM Caudle, Shelton Silvas, FNP [facility]

## 2023-01-25 ENCOUNTER — Other Ambulatory Visit: Payer: Self-pay

## 2023-01-25 LAB — MICROALBUMIN / CREATININE URINE RATIO
Creatinine, Urine: 118 mg/dL (ref 20–275)
Microalb Creat Ratio: 14 mg/g{creat} (ref <30)
Microalb, Ur: 1.7 mg/dL

## 2023-01-25 MED ORDER — DEXCOM G7 SENSOR MISC: 1.0000 | 3 refills | Status: DC

## 2023-01-25 MED ORDER — OMNIPOD 5 G7 PODS (GEN 5) MISC: 1.0000 | 3 refills | Status: DC

## 2023-01-30 ENCOUNTER — Ambulatory Visit: Payer: BC Managed Care – PPO | Admitting: Internal Medicine

## 2023-01-30 DIAGNOSIS — Z130 Encounter for screening for diseases of the blood and blood-forming organs and certain disorders involving the immune mechanism: Secondary | ICD-10-CM | POA: Diagnosis not present

## 2023-01-30 DIAGNOSIS — L680 Hirsutism: Secondary | ICD-10-CM | POA: Diagnosis not present

## 2023-01-30 DIAGNOSIS — Z124 Encounter for screening for malignant neoplasm of cervix: Secondary | ICD-10-CM | POA: Diagnosis not present

## 2023-01-30 DIAGNOSIS — Z01411 Encounter for gynecological examination (general) (routine) with abnormal findings: Secondary | ICD-10-CM | POA: Diagnosis not present

## 2023-01-30 DIAGNOSIS — Z113 Encounter for screening for infections with a predominantly sexual mode of transmission: Secondary | ICD-10-CM | POA: Diagnosis not present

## 2023-02-08 ENCOUNTER — Ambulatory Visit (HOSPITAL_BASED_OUTPATIENT_CLINIC_OR_DEPARTMENT_OTHER): Payer: BC Managed Care – PPO | Admitting: Family Medicine

## 2023-05-01 DIAGNOSIS — L680 Hirsutism: Secondary | ICD-10-CM | POA: Diagnosis not present

## 2023-05-01 DIAGNOSIS — L810 Postinflammatory hyperpigmentation: Secondary | ICD-10-CM | POA: Diagnosis not present

## 2023-05-08 ENCOUNTER — Other Ambulatory Visit: Payer: Self-pay

## 2023-05-08 DIAGNOSIS — E1065 Type 1 diabetes mellitus with hyperglycemia: Secondary | ICD-10-CM

## 2023-05-08 MED ORDER — INSULIN ASPART 100 UNIT/ML IJ SOLN: INTRAMUSCULAR | 3 refills | Status: DC

## 2023-05-23 ENCOUNTER — Encounter (HOSPITAL_BASED_OUTPATIENT_CLINIC_OR_DEPARTMENT_OTHER): Admitting: Family Medicine

## 2023-05-27 ENCOUNTER — Encounter: Payer: Self-pay | Admitting: Internal Medicine

## 2023-05-27 ENCOUNTER — Ambulatory Visit (INDEPENDENT_AMBULATORY_CARE_PROVIDER_SITE_OTHER): Payer: BC Managed Care – PPO | Admitting: Internal Medicine

## 2023-05-27 VITALS — BP 104/68 | HR 92 | Ht 68.0 in | Wt 222.4 lb

## 2023-05-27 DIAGNOSIS — E781 Pure hyperglyceridemia: Secondary | ICD-10-CM | POA: Diagnosis not present

## 2023-05-27 DIAGNOSIS — R635 Abnormal weight gain: Secondary | ICD-10-CM | POA: Diagnosis not present

## 2023-05-27 DIAGNOSIS — E1065 Type 1 diabetes mellitus with hyperglycemia: Secondary | ICD-10-CM | POA: Diagnosis not present

## 2023-05-27 LAB — POCT GLYCOSYLATED HEMOGLOBIN (HGB A1C): Hemoglobin A1C: 8.6 % — AB (ref 4.0–5.6)

## 2023-05-27 MED ORDER — MOUNJARO 2.5 MG/0.5ML ~~LOC~~ SOAJ: 2.5000 mg | SUBCUTANEOUS | 3 refills | Status: DC

## 2023-05-27 NOTE — Progress Notes (Signed)
 Name: Susan [name]  Age/ Sex: 23 y.o., female   MRN/ DOB: [id number], Mar 12, 2000     PCP: Alyson Reedy, FNP   Reason for Endocrinology Evaluation: Type 1 Diabetes Mellitus  Initial Endocrine Consultative Visit: 06/09/2020     PATIENT IDENTIFIER: Susan [name] is a 23 y.o. female with a past medical history of T1DM. The patient has followed with Endocrinology clinic since 06/09/2020 for consultative assistance with management of her diabetes.     DIABETIC HISTORY:  Ms. Lucien was diagnosed with DM in 2020. Was initially on insulin while in [place], per pt was taken off due to low A1c but restarted on insulin during hospitalization 05/2020. Insulin pump started 06/2020 . Her hemoglobin A1c has ranged from 10.1% in 2020, peaking at 10.3% in 2022.   On her initial visit she had an A1c of 10.3 % , we adjusted MDI regimen and was started on Omnipod in 06/2020   IDIOPATHIC HIRSUTISM: Testosterone, 17-OH progesterone, TFT's , DHEAS and 24-hr urinary cortisol have come back all normal 09/2020  SUBJECTIVE:   During the last visit (01/24/2023): A1c 7.6%    Today (05/27/2023): Susan [name] is here for a follow up on diabetes management.  She checks her blood sugars multiple  times daily through CGM.  The patient has been noted with hypoglycemia. She is symptomatic .    She follows with dermatology for postinflammatory hyperpigmentation as well as hirsutism  Denies nausea, or vomiting  Denies constipation or diarrhea  C/o weight gain     This patient with type 1 diabetes is treated with OmniPod (insulin pump). During the visit the pump basal and bolus doses were reviewed including carb/insulin rations and supplemental doses. The clinical list was updated. The glucose meter download was reviewed in detail to determine if the current pump settings are providing the best glycemic control without excessive hypoglycemia.  Pump and meter download:     Pump   Omnipod Settings   Insulin  type   Novolog   Basal rate       0000 0.70              I:C ratio       0000 1:14                  Sensitivity       0000  50      Goal       0000  120            Type & Model of Pump: OmniPod Insulin Type: Currently using NovoLog.  Body mass index is 33.82 kg/m.  PUMP STATISTICS: Average BG: 161 Average Daily Carbs (g): 316.4 Average Total Daily Insulin: 44.3 Average Daily Basal: 23.9(54 %) Average Daily Bolus: 20.4 (46 %)   HOME DIABETES REGIMEN:  Novolog  Statin: no ACE-I/ARB: no Prior Diabetic Education: yes    CONTINUOUS GLUCOSE MONITORING RECORD INTERPRETATION    Dates of Recording: 3/25-05/27/2023  Sensor description:dexcom  Results statistics:   CGM use % of time 59.9  Average 161/56  Time in range 63 %  % Time Above 180 27  % Time above 250 8  % Time Below target 2     Glycemic patterns summary: BG is optimal overnight and fluctuate during the day Hyperglycemic episodes rare-postprandial Hypoglycemic episodes occurred at variable times Overnight periods: Variable      DIABETIC COMPLICATIONS: Microvascular complications:   Denies: CKD, neuropathy, retinopathy  Last Eye Exam: Completed 06/2020  Macrovascular complications:  Denies: CAD, CVA, PVD   HISTORY:  Past Medical History:  Past Medical History:  Diagnosis Date   Diabetes (HCC)    DKA (diabetic ketoacidosis) (HCC) 04/18/2020   DKA, type 1 (HCC) 12/03/2018   Past Surgical History: No past surgical history on file. Social History:  reports that she has never smoked. She has never used smokeless tobacco. She reports that she does not currently use alcohol. She reports that she does not use drugs. Family History:  Family History  Problem Relation Age of Onset   Diabetes Mother    Diabetes Father    Breast cancer Maternal Grandmother    Pancreatic cancer Maternal Grandmother    Colon cancer Neg Hx    Uterine cancer Neg Hx    Ovarian cancer Neg Hx       HOME MEDICATIONS: Allergies as of 05/27/2023   No Known Allergies      Medication List        Accurate as of May 27, 2023 10:05 AM. If you have any questions, ask your nurse or doctor.          cetirizine 10 MG tablet Commonly known as: ZYRTEC Take 10 mg by mouth daily.   Dexcom G7 Sensor Misc 1 Device by Does not apply route as directed.   EPINEPHrine 0.3 mg/0.3 mL Soaj injection Commonly known as: EPI-PEN Inject 0.3 mg into the muscle as needed for anaphylaxis.   hydroquinone 4 % cream 1 application Externally Twice a day   insulin aspart 100 UNIT/ML injection Commonly known as: novoLOG Max daily through pump 60 units   norethindrone 0.35 MG tablet Commonly known as: MICRONOR Take 1 tablet (0.35 mg total) by mouth daily.   Omnipod 5 DexG7G6 Pods Gen 5 Misc 1 Device by Does not apply route every 3 (three) days.   Omnipod 5 G7 Pods (Gen 5) Misc 1 each by Does not apply route every 3 (three) days.   promethazine 25 MG tablet Commonly known as: PHENERGAN Take 25 mg by mouth every 6 (six) hours as needed for nausea or vomiting.   spironolactone 100 MG tablet Commonly known as: ALDACTONE Take 100 mg by mouth daily.         OBJECTIVE:   Vital Signs: BP 104/68 (BP Location: Right Arm, Patient Position: Sitting, Cuff Size: Normal)   Pulse 92   Ht 5\' 8"  (1.727 m)   Wt 222 lb 6.4 oz (100.9 kg)   SpO2 98%   BMI 33.82 kg/m   Wt Readings from Last 3 Encounters:  05/27/23 222 lb 6.4 oz (100.9 kg)  01/24/23 221 lb (100.2 kg)  01/23/23 221 lb 6.4 oz (100.4 kg)     Exam: General: Pt appears well  Lungs: Clear with good BS bilat   Heart: RRR   Abdomen: Soft, nontender  Extremities: No pretibial edema.  Neuro: MS is good with appropriate affect, pt is alert and Ox3    DM foot exam: 05/27/2023  The skin of the feet is intact without sores or ulcerations. The pedal pulses are 2+ on right and 2+ on left. The sensation is intact to a screening  5.07, 10 gram monofilament bilaterally    DATA REVIEWED:  Lab Results  Component Value Date   HGBA1C 8.6 (A) 05/27/2023   HGBA1C 7.6 (A) 01/24/2023   HGBA1C 11.9 (H) 07/30/2022    Latest Reference Range & Units 01/24/23 11:21  Microalb, Ur mg/dL 1.7  MICROALB/CREAT RATIO <30 mg/g creat 14  Creatinine, Urine 20 -  275 mg/dL 161     Latest Reference Range & Units 11/09/22 11:09  Sodium 134 - 144 mmol/L 138  Potassium 3.5 - 5.2 mmol/L 4.9  Chloride 96 - 106 mmol/L 101  CO2 20 - 29 mmol/L 25  Glucose 70 - 99 mg/dL 096 (H)  BUN 6 - 20 mg/dL 6  Creatinine 0.45 - 4.09 mg/dL 8.11  Calcium 8.7 - 91.4 mg/dL 9.9  BUN/Creatinine Ratio 9 - 23  8 (L)  eGFR >59 mL/min/1.73 106  Alkaline Phosphatase 44 - 121 IU/L 79  Albumin 4.0 - 5.0 g/dL 4.3  AST 0 - 40 IU/L 30  ALT 0 - 32 IU/L 18  Total Protein 6.0 - 8.5 g/dL 7.8  Total Bilirubin 0.0 - 1.2 mg/dL 0.3  Total CHOL/HDL Ratio 0.0 - 4.4 ratio 3.4  Cholesterol, Total 100 - 199 mg/dL 782  HDL Cholesterol >95 mg/dL 54  Triglycerides 0 - 621 mg/dL 308 (H)  VLDL Cholesterol Cal 5 - 40 mg/dL 27  LDL Chol Calc (NIH) 0 - 99 mg/dL 657 (H)  (H): Data is abnormally high (L): Data is abnormally low  ASSESSMENT / PLAN / RECOMMENDATIONS:   1) Type 1 Diabetes Mellitus, improving glycemic control, Without complications - Most recent A1c of 8.6%. Goal A1c < 7.0%.     -A1c has increased again  -I will decrease her basal rate as below due to hypoglycemia - Will adjust I:C ratio  - She is having issues with her dexcom, she is in manual mode, pt advised regarding the importance of staying in automatic monitor at all times if possible -I did caution the patient against hypoglycemia while on Mounjaro, patient to contact us to make appropriate adjustments  MEDICATIONS: NovoLog    Pump   Omnipod Settings   Insulin type   Novolog   Basal rate       0000 0.65              I:C ratio       0000 1:12                  Sensitivity       0000   50      Goal       0000  120         EDUCATION / INSTRUCTIONS: BG monitoring instructions: Patient is instructed to check her blood sugars 4 times a day, before meals and bedtime  Call [facility] if: BG persistently < 70  I reviewed the Rule of 15 for the treatment of hypoglycemia in detail with the patient. Literature supplied.   2) Diabetic complications:  Eye: Does not have known diabetic retinopathy.  Neuro/ Feet: Does not have known diabetic peripheral neuropathy .  Renal: Patient does not have known baseline CKD. She   is not on an ACEI/ARB at present.    3) Lipids: No indication for statin therapy .     4) Hypertriglyceridemia :  -Triglyceride elevated in the past, this has improved  5.  Weight gain   -Will prescribe Mounjaro, cautioned against GI side effects -Discussed importance of lifestyle changes with low-carb diet as well as exercise     F/U in 3 months    Signed electronically by: Lyndle Herrlich, MD  [facility]  [facility] [address] [place], [place] [address] Phone: [phone] FAX: [phone]   CC: Alyson Reedy, FNP [address] [place] [address] Phone: [phone]  Fax: [phone]  Return to Endocrinology clinic as below: Future Appointments  Date Time Provider Department Center  07/04/2023  1:10 PM Caudle, Shelton Silvas, FNP [facility]

## 2023-05-27 NOTE — Patient Instructions (Signed)
Start Mounjaro 2.5 mg weekly    HOW TO TREAT LOW BLOOD SUGARS (Blood sugar LESS THAN 70 MG/DL) Please follow the RULE OF 15 for the treatment of hypoglycemia treatment (when your (blood sugars are less than 70 mg/dL)   STEP 1: Take 15 grams of carbohydrates when your blood sugar is low, which includes:  3-4 GLUCOSE TABS  OR 3-4 OZ OF JUICE OR REGULAR SODA OR ONE TUBE OF GLUCOSE GEL    STEP 2: RECHECK blood sugar in 15 MINUTES STEP 3: If your blood sugar is still low at the 15 minute recheck --> then, go back to STEP 1 and treat AGAIN with another 15 grams of carbohydrates.

## 2023-05-30 ENCOUNTER — Encounter: Payer: Self-pay | Admitting: Internal Medicine

## 2023-05-30 ENCOUNTER — Telehealth: Payer: Self-pay

## 2023-05-30 DIAGNOSIS — E1065 Type 1 diabetes mellitus with hyperglycemia: Secondary | ICD-10-CM

## 2023-05-30 NOTE — Telephone Encounter (Signed)
 Mounjaro needs PA

## 2023-05-30 NOTE — Telephone Encounter (Signed)
 Pharmacy Patient Advocate Encounter   Received notification from Pt Calls Messages that prior authorization for Susan [name] is required/requested.   Insurance verification completed.   The patient is insured through [facility] .   Per test claim: PA required; PA started via CoverMyMeds. KEY [id number] . Please see clinical question(s) below that I am not finding the answer to in her chart and advise.     Susan [name] will only be approved with a dx of type 2 diabetes

## 2023-06-05 ENCOUNTER — Ambulatory Visit (INDEPENDENT_AMBULATORY_CARE_PROVIDER_SITE_OTHER)

## 2023-06-05 ENCOUNTER — Other Ambulatory Visit (HOSPITAL_COMMUNITY): Payer: Self-pay

## 2023-06-05 ENCOUNTER — Telehealth: Payer: Self-pay

## 2023-06-05 ENCOUNTER — Ambulatory Visit (INDEPENDENT_AMBULATORY_CARE_PROVIDER_SITE_OTHER): Admitting: Podiatry

## 2023-06-05 DIAGNOSIS — E1065 Type 1 diabetes mellitus with hyperglycemia: Secondary | ICD-10-CM

## 2023-06-05 DIAGNOSIS — M7751 Other enthesopathy of right foot: Secondary | ICD-10-CM | POA: Diagnosis not present

## 2023-06-05 DIAGNOSIS — M2041 Other hammer toe(s) (acquired), right foot: Secondary | ICD-10-CM | POA: Diagnosis not present

## 2023-06-05 MED ORDER — INSULIN ASPART 100 UNIT/ML IJ SOLN: INTRAMUSCULAR | 3 refills | Status: DC

## 2023-06-05 NOTE — Telephone Encounter (Signed)
 Per test claim:  Brand name Novolog vials is preferred by the insurance.  If suggested medication is appropriate, Please send in a new RX and discontinue this one. If not, please advise as to why it's not appropriate so that we may request a Prior Authorization. Please note, some preferred medications may still require a PA.  If the suggested medications have not been trialed and there are no contraindications to their use, the PA will not be submitted, as it will not be approved.   Per test claim: The current 30 day co-pay is, $4.00.  No PA needed at this time. This test claim was processed through [facility]- copay amounts may vary at other pharmacies due to pharmacy/plan contracts, or as the patient moves through the different stages of their insurance plan.    *Test claim was run for [facility] name NOVOLOG VIALS, attempted to call [facility] twice and was hung up on both times. Filling with DAW- 9 OR writing with DAW-1 should fix

## 2023-06-05 NOTE — Telephone Encounter (Signed)
 RX sent to pharmacy

## 2023-06-05 NOTE — Patient Instructions (Signed)
 Get blood work done 1 week before surgery. We need to make sure it is coming down to proceed with surgery.  --  Pre-Operative Instructions  Congratulations, you have decided to take an important step to improving your quality of life.  You can be assured that the doctors of [facility] will be with you every step of the way.  Plan to be at the surgery center/hospital at least 1 (one) hour prior to your scheduled time unless otherwise directed by the surgical center/hospital staff.  You must have a responsible adult accompany you, remain during the surgery and drive you home.  Make sure you have directions to the surgical center/hospital and know how to get there on time. For hospital based surgery you will need to obtain a history and physical form from your family physician within 1 month prior to the date of surgery- we will give you a form for you primary physician.  We make every effort to accommodate the date you request for surgery.  There are however, times where surgery dates or times have to be moved.  We will contact you as soon as possible if a change in schedule is required.   No Aspirin/Ibuprofen for one week before surgery.  If you are on aspirin, any non-steroidal anti-inflammatory medications (Mobic, Aleve, Ibuprofen) you should stop taking it 7 days prior to your surgery.  You make take Tylenol  For pain prior to surgery.  Medications- If you are taking daily heart and blood pressure medications, seizure, reflux, allergy, asthma, anxiety, pain or diabetes medications, make sure the surgery center/hospital is aware before the day of surgery so they may notify you which medications to take or avoid the day of surgery. No food or drink after midnight the night before surgery unless directed otherwise by surgical center/hospital staff. No alcoholic beverages 24 hours prior to surgery.  No smoking 24 hours prior to or 24 hours after surgery. Wear loose pants or shorts- loose enough  to fit over bandages, boots, and casts. No slip on shoes, sneakers are best. Bring your boot with you to the surgery center/hospital.  Also bring crutches or a walker if your physician has prescribed it for you.  If you do not have this equipment, it will be provided for you after surgery. If you have not been contracted by the surgery center/hospital by the day before your surgery, call to confirm the date and time of your surgery. Leave-time from work may vary depending on the type of surgery you have.  Appropriate arrangements should be made prior to surgery with your employer. Prescriptions will be provided immediately following surgery by your doctor.  Have these filled as soon as possible after surgery and take the medication as directed. Remove nail polish on the operative foot. Wash the night before surgery.  The night before surgery wash the foot and leg well with the antibacterial soap provided and water paying special attention to beneath the toenails and in between the toes.  Rinse thoroughly with water and dry well with a towel.  Perform this wash unless told not to do so by your physician.  Enclosed: 1 Ice pack (please put in freezer the night before surgery)   1 Hibiclens skin cleaner   Pre-op Instructions  If you have any questions regarding the instructions, do not hesitate to call our office at any point during this process.   [facility]: [address] [place], [place] [address] [phone]  [facility]: [phone] Patra Bonnet  Zada Herrlich [place], [place] [address] [phone]  Dr. Bobbie Burows, DPM

## 2023-06-06 ENCOUNTER — Telehealth: Payer: Self-pay | Admitting: Podiatry

## 2023-06-06 ENCOUNTER — Encounter: Payer: Self-pay | Admitting: Podiatry

## 2023-06-06 LAB — HEMOGLOBIN A1C
Hgb A1c MFr Bld: 8.8 % — ABNORMAL HIGH (ref <5.7)
Mean Plasma Glucose: 206 mg/dL
eAG (mmol/L): 11.4 mmol/L

## 2023-06-06 MED ORDER — NOVOLOG 100 UNIT/ML IJ SOLN: INTRAMUSCULAR | 11 refills | Status: DC

## 2023-06-06 NOTE — Telephone Encounter (Signed)
 Left message for pt to call to schedule her surgery with Dr Clydia Dart

## 2023-06-06 NOTE — Addendum Note (Signed)
 Addended by: Valentina Gasman on: 06/06/2023 02:19 PM   Modules accepted: Orders

## 2023-06-09 NOTE — Progress Notes (Signed)
 Subjective:  Patient ID: Susan [name], female    DOB: 09-21-2000,  MRN: [id number]  Chief Complaint  Patient presents with   Consult    RM#11 Reschedule surgery and check on toes progression right foot.    Discussed the use of AI scribe software for clinical note transcription with the patient, who gave verbal consent to proceed.  History of Present Illness The patient, with a history of diabetes, presents with ongoing foot pain, particularly in the toes. The pain is exacerbated by standing for prolonged periods. She has been avoiding her typical shoes to alleviate discomfort. She denies any wounds on her feet.  The patient's blood sugar levels have been fluctuating recently due to a recent illness that required a course of steroids, which caused a temporary increase in her blood sugar levels. Her most recent A1c was 8.6, up from previous measurements. She reports that her blood sugar levels are now decreasing.  The patient's foot pain is localized to the fourth and fifth toes, particularly underneath the toes. The pain is attributed to the curling of these toes. The patient has been managing the pain with non-surgical interventions such as pads, cushions, and shoe modifications.      Objective:    Physical Exam  General: AAO x3, NAD  Dermatological: Skin is warm, dry and supple bilateral. There are no open sores, no preulcerative lesions, no rash or signs of infection present.  Vascular: Dorsalis Pedis artery and Posterior Tibial artery pedal pulses are 2/4 bilateral with immedate capillary fill time.  There is no pain with calf compression, swelling, warmth, erythema.   Neruologic: Grossly intact via light touch bilateral.   Musculoskeletal: Digital contractures noted to right 4th and 5th toes with palpation his digits.  Toes are also rubbing chatterbox causes discomfort.  There is no other areas of tenderness identified at this time.  Gait: Unassisted, Nonantalgic.     No  images are attached to the encounter.    Results LABS HbA1c: 8.6%   Assessment:   1. Hammertoe of right foot   2. Type 1 diabetes mellitus with hyperglycemia (HCC)      Plan:  Patient was evaluated and treated and all questions answered.  Assessment and Plan Assessment & Plan Hammertoe deformity Chronic hammertoe deformity of the right fourth and fifth toes causing pain. Surgical correction planned with arthroplasty of the fifth digit and arthrodesis of the fourth toe.  - She is attempted conservative treatment she was to proceed with surgical intervention. - The incision placement as well as the postoperative course was discussed with the patient. I discussed risks of the surgery which include, but not limited to, infection, bleeding, pain, swelling, need for further surgery, delayed or nonhealing, painful or ugly scar, numbness or sensation changes, over/under correction, recurrence, transfer lesions, further deformity, hardware failure, DVT/PE, loss of toe/foot. Patient understands these risks and wishes to proceed with surgery. The surgical consent was reviewed with the patient all 3 pages were signed. No promises or guarantees were given to the outcome of the procedure. All questions were answered to the best of my ability. Before the surgery the patient was encouraged to call the office if there is any further questions. The surgery will be performed at the [facility] on an outpatient basis. - Schedule surgical correction on May 7th, 2025. - Advise wearing special surgical shoes for 4-6 weeks post-surgery. - Provide prophylactic antibiotics on the morning of surgery and post-operatively. - Instruct on avoiding driving during recovery due to wires. -  Educate on wearing supportive shoes post-recovery to prevent recurrence.  Diabetes mellitus Diabetes with elevated A1c at 8.6% due to illness and steroid use. A1c needs re-evaluation to ensure it is decreasing for optimal surgical  healing. - Order A1c test prior to surgery to ensure levels are decreasing. - Monitor blood glucose levels to ensure control and no impact on surgical outcomes.  Charity Conch DPM     No follow-ups on file.

## 2023-06-13 ENCOUNTER — Encounter: Payer: Self-pay | Admitting: Internal Medicine

## 2023-06-13 NOTE — Telephone Encounter (Signed)
 Reviewed OmniPod 5 download from April 11 2 Abrol 24, 2025, 14 days.  Total daily dose 22.3 with basal 48% 10.7 units and bolus 52%.  Mostly blood sugar overnight acceptable with occasional mild hyperglycemia followed by mild hypoglycemia with blood sugar in 60s, some of the time hypoglycemia related to meal bolus.  Some of the time hypoglycemia in between the meals.  Change the pump setting as follows.    Change Basal rate from 0.65 to 0.55 units/h.  Change sensitivity from 1 I: 50- to 1:60  Change carb ratio from 1:12 to 1:15  Nursing: Please guide patient to change the pump setting as suggested above.  Iraq Darrick Greenlaw, MD [facility] [address] [place], [address] [place], [place] [address] Phone # [phone]

## 2023-06-18 ENCOUNTER — Telehealth: Payer: Self-pay | Admitting: Podiatry

## 2023-06-18 NOTE — Telephone Encounter (Signed)
 DOS:  06/26/23  (RT) HAMMER TOE REPAIR 4TH/5TH [id number]    EFFECTIVE DATE:    09/20/2022 - 09/19/2023   DEDUCTIBLE:  $500.00  REMAINING:   $500.00  OOP:  $4,000.00  REMAINING : $2,709.05    CO INSURANCE: 30%  PER THE [employer] CPT CODE 40981 IS APPROVED. REF# [id number]

## 2023-06-19 ENCOUNTER — Encounter: Payer: Self-pay | Admitting: Internal Medicine

## 2023-06-24 ENCOUNTER — Telehealth: Payer: Self-pay | Admitting: Podiatry

## 2023-06-24 NOTE — Telephone Encounter (Signed)
 Left message for pt to call to discuss further her elevated A1c per Dr Clydia Dart. Pt scheduled for surgery 5/7 and Dr Clydia Dart is concerned since A1c went up to 8.8

## 2023-06-25 ENCOUNTER — Telehealth: Payer: Self-pay | Admitting: Podiatry

## 2023-06-25 ENCOUNTER — Other Ambulatory Visit: Payer: Self-pay | Admitting: Podiatry

## 2023-06-25 DIAGNOSIS — E1065 Type 1 diabetes mellitus with hyperglycemia: Secondary | ICD-10-CM

## 2023-06-25 NOTE — Telephone Encounter (Addendum)
 CALLED PT AGAIN AND LEFT MESSAGE THAT IT WAS URGENT FOR HER TO CALL REGARDING HER SURGERY THAT IS SCHEDULED FOR TOMORROW.06/26/23  Pt returned call and I explained her A1c has went up instead of down and Dr Clydia Dart is concerned about the pt healing properly if he were to do the surgery. We need to postpone the surgery.  I explained that Dr Clydia Dart would like pt to have her A1c checked in about a month and then if it is down we can get the surgery rescheduled.  Lvm for surgery center surgery needs to be canceled.

## 2023-06-27 NOTE — Telephone Encounter (Signed)
 Pt notified orders are in for lab and to go in a month and we would call once we get results.

## 2023-07-01 ENCOUNTER — Encounter: Admitting: Podiatry

## 2023-07-04 ENCOUNTER — Ambulatory Visit (INDEPENDENT_AMBULATORY_CARE_PROVIDER_SITE_OTHER): Admitting: Family Medicine

## 2023-07-04 ENCOUNTER — Encounter (HOSPITAL_BASED_OUTPATIENT_CLINIC_OR_DEPARTMENT_OTHER): Payer: Self-pay | Admitting: Family Medicine

## 2023-07-04 VITALS — BP 101/66 | HR 103 | Ht 68.0 in | Wt 212.0 lb

## 2023-07-04 DIAGNOSIS — Z13220 Encounter for screening for lipoid disorders: Secondary | ICD-10-CM | POA: Diagnosis not present

## 2023-07-04 DIAGNOSIS — E1065 Type 1 diabetes mellitus with hyperglycemia: Secondary | ICD-10-CM

## 2023-07-04 DIAGNOSIS — Z0000 Encounter for general adult medical examination without abnormal findings: Secondary | ICD-10-CM | POA: Diagnosis not present

## 2023-07-04 NOTE — Patient Instructions (Addendum)
 Please consider updating your TDAP shot. Make sure you have received your HPV vaccines.   Ophthalmology Offices   [facility]  [address]  [place], [place] [address] [phone]  [facility] [address] [place], [place] [address] Phone: [phone]  [facility] [address] [place], [place] [address] Phone: [phone]  [facility]  Optometrist [address]  [phone]  [facility] Optometrist [address]  [phone]

## 2023-07-04 NOTE — Progress Notes (Signed)
 Subjective:   Susan [name] 2000-11-17  07/04/2023   CC: Chief Complaint  Patient presents with   Annual Exam    Patient is here today for her physical. Denies any concerns for today's visit.    HPI: Maven Whitcomb is a 23 y.o. female who presents for a routine health maintenance exam.  Labs collected at time of visit.    HEALTH SCREENINGS: - Vision Screening: Recommended due to DM - Dental Visits: Scheduled - Pap smear: up to date - Breast Exam: up to date - STD Screening: Declined - Mammogram (40+): Not applicable  - Colonoscopy (45+): Not applicable  - Bone Density (65+ or under 65 with predisposing conditions): Not applicable  - Lung CA screening with low-dose CT:  Not applicable Adults age 86-80 who are current cigarette smokers or quit within the last 15 years. Must have 20 pack year history.   Depression and Anxiety Screen done today and results listed below:     07/04/2023    1:14 PM 11/09/2022   10:57 AM 08/08/2022    1:42 PM 06/10/2020    1:17 PM  Depression screen PHQ 2/9  Decreased Interest 0 1 1 0  Down, Depressed, Hopeless 0 0 1 0  PHQ - 2 Score 0 1 2 0  Altered sleeping 0 1 0   Tired, decreased energy 0 1 1   Change in appetite 0 0 0   Feeling bad or failure about yourself  0 0    Trouble concentrating 0 0 0   Moving slowly or fidgety/restless 0 0 0   Suicidal thoughts 0 0 0   PHQ-9 Score 0 3 3   Difficult doing work/chores Not difficult at all Not difficult at all Not difficult at all       07/04/2023    1:14 PM 11/09/2022   10:58 AM 08/08/2022    1:43 PM  GAD 7 : Generalized Anxiety Score  Nervous, Anxious, on Edge 0 1 0  Control/stop worrying 0 0 0  Worry too much - different things 0 0 0  Trouble relaxing 0 0 0  Restless 0 0 0  Easily annoyed or irritable 0 0 0  Afraid - awful might happen 0 0 0  Total GAD 7 Score 0 1 0  Anxiety Difficulty Not difficult at all Not difficult at all Not difficult at all    IMMUNIZATIONS: - Tdap: Tetanus  vaccination status reviewed: Pt declined. - HPV: Pt states she received in [facility]; will check records - Influenza: Postponed to flu season - Pneumovax: Not applicable - Prevnar 20: Not applicable - Shingrix (50+): Not applicable   Past medical history, surgical history, medications, allergies, family history and social history reviewed with patient today and changes made to appropriate areas of the chart.   Past Medical History:  Diagnosis Date   Diabetes (HCC)    DKA (diabetic ketoacidosis) (HCC) 04/18/2020   DKA, type 1 (HCC) 12/03/2018    History reviewed. No pertinent surgical history.  Current Outpatient Medications on File Prior to Visit  Medication Sig   cetirizine (ZYRTEC) 10 MG tablet Take 10 mg by mouth daily.   Continuous Glucose Sensor (DEXCOM G7 SENSOR) MISC 1 Device by Does not apply route as directed.   EPINEPHrine  0.3 mg/0.3 mL IJ SOAJ injection Inject 0.3 mg into the muscle as needed for anaphylaxis.   hydroquinone 4 % cream 1 application Externally Twice a day   Insulin  Disposable Pump (OMNIPOD 5 DEXG7G6 PODS GEN 5) MISC 1  Device by Does not apply route every 3 (three) days.   Insulin  Disposable Pump (OMNIPOD 5 G7 PODS, GEN 5,) MISC 1 each by Does not apply route every 3 (three) days.   norethindrone  (MICRONOR ) 0.35 MG tablet Take 1 tablet (0.35 mg total) by mouth daily.   NOVOLOG  100 UNIT/ML injection Max daily 60 units   spironolactone (ALDACTONE) 100 MG tablet Take 100 mg by mouth daily.   tirzepatide (MOUNJARO) 2.5 MG/0.5ML Pen Inject 2.5 mg into the skin once a week.   No current facility-administered medications on file prior to visit.    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   Social Drivers of Health   Financial Resource Strain: Low Risk  (01/22/2023)   Overall Financial Resource Strain (CARDIA)    Difficulty of Paying Living Expenses: Not very hard  Food Insecurity: Food Insecurity Present (01/22/2023)   Hunger Vital Sign    Worried About Running Out of Food in the Last Year: Sometimes true    Ran Out of Food in the Last Year: Sometimes true  Transportation Needs: No Transportation Needs (01/22/2023)   PRAPARE - Administrator, Civil Service (Medical): No    Lack of Transportation (Non-Medical): No  Physical Activity: Sufficiently Active (01/22/2023)   Exercise Vital Sign    Days of Exercise per Week: 3 days    Minutes of Exercise per Session: 50 min  Stress: No Stress Concern Present (01/22/2023)   [employer] of Occupational Health - Occupational Stress Questionnaire    Feeling of Stress : Only a little  Social Connections: Moderately Isolated (01/22/2023)   Social Connection and Isolation Panel [NHANES]    Frequency of Communication with Friends and Family: More than three times a week    Frequency of Social Gatherings with Friends and Family: More than three times a week    Attends Religious Services: Never    Database administrator or Organizations: Yes    Attends Engineer, structural: More than 4 times per year    Marital Status: Never married  Intimate Partner Violence: Not At Risk (07/30/2022)   Humiliation, Afraid, Rape, and Kick questionnaire    Fear of Current or Ex-Partner: No    Emotionally Abused: No    Physically Abused: No    Sexually Abused: No   Social  History   Tobacco Use  Smoking Status Never  Smokeless Tobacco Never   Social History   Substance and Sexual Activity  Alcohol Use Not Currently   Comment: occ    Family History  Problem Relation Age of Onset   Diabetes Mother    Diabetes Father    Breast cancer Maternal Grandmother    Pancreatic cancer Maternal Grandmother  Colon cancer Neg Hx    Uterine cancer Neg Hx    Ovarian cancer Neg Hx      ROS: Denies fever, fatigue, unexplained weight loss/gain, chest pain, SHOB, and palpitations. Denies neurological deficits, gastrointestinal or genitourinary complaints, and skin changes.   Objective:   Today's Vitals   07/04/23 1311  BP: 101/66  Pulse: (!) 103  SpO2: 99%  Weight: 212 lb (96.2 kg)  Height: 5\' 8"  (1.727 m)    GENERAL APPEARANCE: Well-appearing, in NAD. Well nourished.  SKIN: Pink, warm and dry. Turgor normal. No rash, lesion, ulceration, or ecchymoses. Hair evenly distributed.  HEENT: HEAD: Normocephalic.  EYES: PERRLA. EOMI. Lids intact w/o defect. Sclera white, Conjunctiva pink w/o exudate.  EARS: External ear w/o redness, swelling, masses or lesions. EAC clear. TM's intact, translucent w/o bulging, appropriate landmarks visualized. Appropriate acuity to conversational tones.  NOSE: Septum midline w/o deformity. Nares patent, mucosa pink and non-inflamed w/o drainage. No sinus tenderness.  THROAT: Uvula midline. Oropharynx clear. Tonsils non-inflamed w/o exudate. Oral mucosa pink and moist.  NECK: Supple, Trachea midline. Full ROM w/o pain or tenderness. No lymphadenopathy. Thyroid  non-tender w/o enlargement or palpable masses.  BREASTS: Breasts pendulous, symmetrical, and w/o palpable masses. Nipples everted and w/o discharge. No rash or skin retraction. No axillary or supraclavicular lymphadenopathy.  RESPIRATORY: Chest wall symmetrical w/o masses. Respirations even and non-labored. Breath sounds clear to auscultation bilaterally. No wheezes, rales,  rhonchi, or crackles. CARDIAC: S1, S2 present, regular rate and rhythm. No gallops, murmurs, rubs, or clicks. PMI w/o lifts, heaves, or thrills. No carotid bruits. Capillary refill <2 seconds. Peripheral pulses 2+ bilaterally. GI: Abdomen soft w/o distention. Normoactive bowel sounds. No palpable masses or tenderness. No guarding or rebound tenderness. Liver and spleen w/o tenderness or enlargement. No CVA tenderness.  GU: External genitalia without erythema, lesions, or masses. No lymphadenopathy. Vaginal mucosa pink and moist without exudate, lesions, or ulcerations. Cervix pink without discharge. Cervical os closed. Uterus and adnexae palpable, not enlarged, and w/o tenderness. No palpable masses.  MSK: Muscle tone and strength appropriate for age, w/o atrophy or abnormal movement.  EXTREMITIES: Active ROM intact, w/o tenderness, crepitus, or contracture. No obvious joint deformities or effusions. No clubbing, edema, or cyanosis.  NEUROLOGIC: CN's II-XII intact. Motor strength symmetrical with no obvious weakness. No sensory deficits. DTR's 2+ symmetric bilaterally. Steady, even gait.  PSYCH/MENTAL STATUS: Alert, oriented x 3. Cooperative, appropriate mood and affect.   Chaperoned by Fredia Janus, CMA   Results for orders placed or performed in visit on 06/05/23  Hemoglobin A1c   Collection Time: 06/05/23 11:21 AM  Result Value Ref Range   Hgb A1c MFr Bld 8.8 (H) <5.7 %   Mean Plasma Glucose 206 mg/dL   eAG (mmol/L) 95.6 mmol/L    Assessment & Plan:  1. Annual physical exam (Primary) Korayma is doing well overall. We discussed healthy diet, exercise and prevention. Recommended she update her TDAP, and verify she has had her HPV vaccines. UTD on pap, breast exam.   Patient declines routine vaccines. We discussed the risk of this means means there could be underlying issue or malignancy not identified, increased morbidity, and even mortality. Patient verbalized understanding and acceptance  of risk. Patient knows they can change their mind at any time and we will be happy to coordinate these things for them.   - Lipid panel - CBC with Differential/Platelet - Comprehensive metabolic panel with GFR  2. Screening for lipid disorders - Lipid panel  3. Type 1  diabetes mellitus with hyperglycemia (HCC) Managed by Endocrinology. PCP provided ophthalmology offices to complete DM eye exam. Recent visit reviewed by PCP. Will continue as scheduled.    Orders Placed This Encounter  Procedures   Lipid panel   CBC with Differential/Platelet   Comprehensive metabolic panel with GFR    PATIENT COUNSELING:  - Encouraged a healthy well-balanced diet. Patient may adjust caloric intake to maintain or achieve ideal body weight. May reduce intake of dietary saturated fat and total fat and have adequate dietary potassium and calcium preferably from fresh fruits, vegetables, and low-fat dairy products.   - Advised to avoid cigarette smoking. - Discussed with the patient that most people either abstain from alcohol or drink within safe limits (<=14/week and <=4 drinks/occasion for males, <=7/weeks and <= 3 drinks/occasion for females) and that the risk for alcohol disorders and other health effects rises proportionally with the number of drinks per week and how often a drinker exceeds daily limits. - Discussed cessation/primary prevention of drug use and availability of treatment for abuse.  - Discussed sexually transmitted diseases, avoidance of unintended pregnancy and contraceptive alternatives.  - Stressed the importance of regular exercise - Injury prevention: Discussed safety belts, safety helmets, smoke detector, smoking near bedding or upholstery.  - Dental health: Discussed importance of regular tooth brushing, flossing, and dental visits.   NEXT PREVENTATIVE PHYSICAL DUE IN 1 YEAR.  Return in about 1 year (around 07/03/2024) for ANNUAL PHYSICAL.  Patient to reach out to office if  new, worrisome, or unresolved symptoms arise or if no improvement in patient's condition. Patient verbalized understanding and is agreeable to treatment plan. All questions answered to patient's satisfaction.    Nonda Bays, [place]

## 2023-07-05 ENCOUNTER — Ambulatory Visit (HOSPITAL_BASED_OUTPATIENT_CLINIC_OR_DEPARTMENT_OTHER): Payer: Self-pay | Admitting: Family Medicine

## 2023-07-05 LAB — CBC WITH DIFFERENTIAL/PLATELET
Basophils Absolute: 0 x10E3/uL (ref 0.0–0.2)
Basos: 1 %
EOS (ABSOLUTE): 0.1 x10E3/uL (ref 0.0–0.4)
Eos: 2 %
Hematocrit: 41.3 % (ref 34.0–46.6)
Hemoglobin: 13 g/dL (ref 11.1–15.9)
Immature Grans (Abs): 0 x10E3/uL (ref 0.0–0.1)
Immature Granulocytes: 0 %
Lymphocytes Absolute: 1.7 x10E3/uL (ref 0.7–3.1)
Lymphs: 43 %
MCH: 28.9 pg (ref 26.6–33.0)
MCHC: 31.5 g/dL (ref 31.5–35.7)
MCV: 92 fL (ref 79–97)
Monocytes Absolute: 0.4 x10E3/uL (ref 0.1–0.9)
Monocytes: 11 %
Neutrophils Absolute: 1.7 x10E3/uL (ref 1.4–7.0)
Neutrophils: 43 %
Platelets: 261 x10E3/uL (ref 150–450)
RBC: 4.5 x10E6/uL (ref 3.77–5.28)
RDW: 12.4 % (ref 11.7–15.4)
WBC: 3.9 x10E3/uL (ref 3.4–10.8)

## 2023-07-05 LAB — COMPREHENSIVE METABOLIC PANEL WITH GFR
ALT: 12 IU/L (ref 0–32)
AST: 20 IU/L (ref 0–40)
Albumin: 4.7 g/dL (ref 4.0–5.0)
Alkaline Phosphatase: 81 IU/L (ref 44–121)
BUN/Creatinine Ratio: 13 (ref 9–23)
BUN: 11 mg/dL (ref 6–20)
Bilirubin Total: 0.4 mg/dL (ref 0.0–1.2)
CO2: 20 mmol/L (ref 20–29)
Calcium: 9.9 mg/dL (ref 8.7–10.2)
Chloride: 101 mmol/L (ref 96–106)
Creatinine, Ser: 0.84 mg/dL (ref 0.57–1.00)
Globulin, Total: 3.5 g/dL (ref 1.5–4.5)
Glucose: 88 mg/dL (ref 70–99)
Potassium: 4.5 mmol/L (ref 3.5–5.2)
Sodium: 136 mmol/L (ref 134–144)
Total Protein: 8.2 g/dL (ref 6.0–8.5)
eGFR: 99 mL/min/1.73

## 2023-07-05 LAB — LIPID PANEL
Chol/HDL Ratio: 3.7 ratio (ref 0.0–4.4)
Cholesterol, Total: 147 mg/dL (ref 100–199)
HDL: 40 mg/dL (ref >39)
LDL Chol Calc (NIH): 92 mg/dL (ref 0–99)
Triglycerides: 75 mg/dL (ref 0–149)
VLDL Cholesterol Cal: 15 mg/dL (ref 5–40)

## 2023-07-05 NOTE — Progress Notes (Signed)
 Hi Susan [name],  Your cholesterol is normal. Your blood counts are normal without signs of blood loss or anemia. Your electrolytes, kidney function, and liver function is stable.

## 2023-07-11 ENCOUNTER — Encounter: Admitting: Podiatry

## 2023-07-25 ENCOUNTER — Encounter: Admitting: Podiatry

## 2023-07-29 ENCOUNTER — Encounter: Payer: Self-pay | Admitting: Internal Medicine

## 2023-08-27 ENCOUNTER — Ambulatory Visit: Admitting: Internal Medicine

## 2023-10-16 DIAGNOSIS — M25571 Pain in right ankle and joints of right foot: Secondary | ICD-10-CM | POA: Diagnosis not present

## 2023-10-28 ENCOUNTER — Other Ambulatory Visit: Payer: Self-pay | Admitting: Internal Medicine

## 2023-10-30 ENCOUNTER — Encounter: Payer: Self-pay | Admitting: Internal Medicine

## 2023-10-31 MED ORDER — AMOXICILLIN-POT CLAVULANATE 875-125 MG PO TABS: 1.0000 | ORAL_TABLET | Freq: Two times a day (BID) | ORAL | 0 refills | Status: DC

## 2023-10-31 MED ORDER — INSULIN PEN NEEDLE 32G X 4 MM MISC: 1.0000 | Freq: Four times a day (QID) | 3 refills | Status: DC

## 2023-10-31 MED ORDER — LANTUS SOLOSTAR 100 UNIT/ML ~~LOC~~ SOPN: 18.0000 [IU] | PEN_INJECTOR | Freq: Every day | SUBCUTANEOUS | 0 refills | Status: DC

## 2023-10-31 NOTE — Progress Notes (Unsigned)
 Name: Susan [name]  Age/ Sex: 23 y.o., female   MRN/ DOB: [id number], [birth year]     PCP: Knute Thersia Bitters, FNP   Reason for Endocrinology Evaluation: Type 1 Diabetes Mellitus  Initial Endocrine Consultative Visit: 06/09/2020     PATIENT IDENTIFIER: Susan [name] is a 23 y.o. female with a past medical history of T1DM. The patient has followed with Endocrinology clinic since 06/09/2020 for consultative assistance with management of her diabetes.     DIABETIC HISTORY:  Ms. Stenglein was diagnosed with DM in 2020. Was initially on insulin  while in [place], per pt was taken off due to low A1c but restarted on insulin  during hospitalization 05/2020. Insulin  pump started 06/2020 . Her hemoglobin A1c has ranged from 10.1% in 2020, peaking at 10.3% in 2022.   On her initial visit she had an A1c of 10.3 % , we adjusted MDI regimen and was started on Omnipod in 06/2020  Insurance would not cover GLP-1 agonist due to T1DM     IDIOPATHIC HIRSUTISM: Testosterone, 17-OH progesterone, TFT's , DHEAS and 24-hr urinary cortisol have come back all normal 09/2020  SUBJECTIVE:   During the last visit (05/27/2023): A1c 8.6%    Today (11/01/2023): Susan [name] is here for a follow up on diabetes management.  She checks her blood sugars multiple  times daily through CGM.  The patient has had hypoglycemia. She is symptomatic .    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 During the visit the pump basal and bolus doses were reviewed including carb/insulin  rations and supplemental doses. The clinical list was updated. The glucose meter download was reviewed in detail to  determine if the current pump settings are providing the best glycemic control without excessive hypoglycemia.  Pump and meter download:    Pump   Omnipod Settings   Insulin  type   Novolog    Basal rate       0000 0.65              I:C ratio       0000 1:12                  Sensitivity       0000  65      Goal       0000  120            Type & Model of Pump: OmniPod Insulin  Type: Currently using NovoLog .  Body mass index is 32.99 kg/m.  PUMP STATISTICS: Average BG: 182 Average Daily Carbs (g): 241.2 Average Total Daily Insulin : 39.7 Average Daily Basal: 20.6(52 %) Average Daily Bolus: 19.1 (48 %)   HOME DIABETES REGIMEN:  Novolog  Lantus  18 units daily  Statin: no ACE-I/ARB: no Prior Diabetic Education: yes    CONTINUOUS GLUCOSE MONITORING RECORD INTERPRETATION    Dates of Recording: 8/30-9/01/2024  Sensor description:dexcom  Results statistics:   CGM use % of time 53.3  Average 182/57  Time in range 53 %  % Time Above 180 34  % Time above 250 13  % Time Below target 0     Glycemic patterns summary: BGs are optimal overnight and fluctuate during the  day Hyperglycemic episodes rare-postprandial Hypoglycemic episodes occurred rare, during the day, most likely following a late bolus Overnight periods: Mostly optimal      DIABETIC COMPLICATIONS: Microvascular complications:   Denies: CKD, neuropathy, retinopathy  Last Eye Exam: Completed 06/2020  Macrovascular complications:   Denies: CAD, CVA, PVD   HISTORY:  Past Medical History:  Past Medical History:  Diagnosis Date   Diabetes (HCC)    DKA (diabetic ketoacidosis) (HCC) 04/18/2020   DKA, type 1 (HCC) 12/03/2018   Past Surgical History: No past surgical history on file. Social History:  reports that she has never smoked. She has never used smokeless tobacco. She reports that she does not currently use alcohol. She reports that she does not use drugs. Family History:   Family History  Problem Relation Age of Onset   Diabetes Mother    Diabetes Father    Breast cancer Maternal Grandmother    Pancreatic cancer Maternal Grandmother    Colon cancer Neg Hx    Uterine cancer Neg Hx    Ovarian cancer Neg Hx      HOME MEDICATIONS: Allergies as of 11/01/2023   No Known Allergies      Medication List        Accurate as of November 01, 2023  7:50 AM. If you have any questions, ask your nurse or doctor.          amoxicillin -clavulanate 875-125 MG tablet Commonly known as: AUGMENTIN  Take 1 tablet by mouth 2 (two) times daily.   cetirizine 10 MG tablet Commonly known as: ZYRTEC Take 10 mg by mouth daily.   Dexcom G7 Sensor Misc 1 Device by Does not apply route as directed.   EPINEPHrine  0.3 mg/0.3 mL Soaj injection Commonly known as: EPI-PEN Inject 0.3 mg into the muscle as needed for anaphylaxis.   hydroquinone 4 % cream 1 application Externally Twice a day   Insulin  Pen Needle 32G X 4 MM Misc 1 Device by Does not apply route in the morning, at noon, in the evening, and at bedtime.   Lantus  SoloStar 100 UNIT/ML Solostar Pen Generic drug: insulin  glargine Inject 18 Units into the skin daily.   Mounjaro  2.5 MG/0.5ML Pen Generic drug: tirzepatide  Inject 2.5 mg into the skin once a week.   norethindrone  0.35 MG tablet Commonly known as: MICRONOR  Take 1 tablet (0.35 mg total) by mouth daily.   NovoLOG  100 UNIT/ML injection Generic drug: insulin  aspart Max daily 60 units   Omnipod 5 G7 Pods (Gen 5) Misc 1 each by Does not apply route every 3 (three) days.   Omnipod 5 DexG7G6 Pods Gen 5 Misc USE EVERY 3 DAYS   spironolactone 100 MG tablet Commonly known as: ALDACTONE Take 100 mg by mouth daily.         OBJECTIVE:   Vital Signs: BP 126/74 (BP Location: Left Arm, Patient Position: Sitting, Cuff Size: Normal)   Pulse 94   Ht 5' 8 (1.727 m)   Wt 217 lb (98.4 kg)   SpO2 96%   BMI 32.99 kg/m   Wt Readings from Last  3 Encounters:  11/01/23 217 lb (98.4 kg)  07/04/23 212 lb (96.2 kg)  05/27/23 222 lb 6.4 oz (100.9 kg)     Exam: General: Pt appears well  Lungs: Clear with good BS bilat   Heart: RRR   Abdomen: Soft, nontender  Extremities: No pretibial edema.  Neuro: MS is good with appropriate affect, pt is alert and Ox3    DM foot exam:  05/27/2023  The skin of the feet is intact without sores or ulcerations. The pedal pulses are 2+ on right and 2+ on left. The sensation is intact to a screening 5.07, 10 gram monofilament bilaterally    DATA REVIEWED:  Lab Results  Component Value Date   HGBA1C 8.8 (H) 06/05/2023   HGBA1C 8.6 (A) 05/27/2023   HGBA1C 7.6 (A) 01/24/2023    Latest Reference Range & Units 07/04/23 13:35  Sodium 134 - 144 mmol/L 136  Potassium 3.5 - 5.2 mmol/L 4.5  Chloride 96 - 106 mmol/L 101  CO2 20 - 29 mmol/L 20  Glucose 70 - 99 mg/dL 88  BUN 6 - 20 mg/dL 11  Creatinine 9.42 - 8.99 mg/dL 9.15  Calcium 8.7 - 89.7 mg/dL 9.9  BUN/Creatinine Ratio 9 - 23  13  eGFR >59 mL/min/1.73 99  Alkaline Phosphatase 44 - 121 IU/L 81  Albumin 4.0 - 5.0 g/dL 4.7  AST 0 - 40 IU/L 20  ALT 0 - 32 IU/L 12  Total Protein 6.0 - 8.5 g/dL 8.2  Total Bilirubin 0.0 - 1.2 mg/dL 0.4  Total CHOL/HDL Ratio 0.0 - 4.4 ratio 3.7  Cholesterol, Total 100 - 199 mg/dL 852  HDL Cholesterol >60 mg/dL 40  Triglycerides 0 - 850 mg/dL 75  VLDL Cholesterol Cal 5 - 40 mg/dL 15  LDL Chol Calc (NIH) 0 - 99 mg/dL 92      ASSESSMENT / PLAN / RECOMMENDATIONS:   1) Type 1 Diabetes Mellitus, improving glycemic control, Without complications - Most recent A1c of 8.1%. Goal A1c < 7.0%.     -A1c is trending down - She initially was going to return to using multiple daily injections of insulin  and she did have painful arm with discharge from OmniPod insertion site yesterday, but despite prescribing Lantus  at 8:30 AM, patient states the pharmacy did did not have Lantus  available and she is back to using the  OmniPod - She is interested in switching to tandem MOBI pump, patient will return for fasting serum glucose and C-peptide - No changes at this time - She will be provided with basal/prandial insulin  in case she wants to use pens  MEDICATIONS:  Lantus  18 units daily  NovoLog  1:12 TIDQAC CF: Novolog  (BG-120/45) TID  Pump   Omnipod Settings   Insulin  type   Novolog    Basal rate       0000 0.65              I:C ratio       0000 1:12                  Sensitivity       0000  50      Goal       0000  120         EDUCATION / INSTRUCTIONS: BG monitoring instructions: Patient is instructed to check her blood sugars 4 times a day, before meals and bedtime  Call [facility] if: BG persistently < 70  I reviewed the Rule of 15 for the treatment of hypoglycemia in detail with the patient. Literature supplied.   2) Diabetic complications:  Eye: Does not have known diabetic retinopathy.  Neuro/ Feet: Does not have known diabetic peripheral neuropathy .  Renal: Patient does not have known baseline CKD. She   is not on an ACEI/ARB at present.     3) Hypertriglyceridemia :  -Triglyceride elevated in the past, this has improved   4) Weight gain /  Obesity :  - Pt with BMI of 32 - Will prescribe Zepbound , she has tried Mounjaro  in the past before her insurance declined a refill due to a diagnosis of T1DM rather than T2DM   Medication Start Zepbound  2.5 mg weekly for 4 weeks, then increase to 5 mg weekly   F/U in 4 months   5) Right arm infection :  - No discharge at this time, but the patient has been noted with a nodularity at the prior OmniPod insertion site - Patient encouraged to start Augmentin  800 mg twice daily x 10 days   Signed electronically by: Stefano Redgie Butts, MD  [facility]  [facility] [address] [place] [place], [place] [address] Phone: [phone] FAX: [phone]   CC: Knute Thersia Bitters, FNP [address] [place] [address] Phone: [phone]  Fax: [phone]  Return to Endocrinology clinic as below: Future Appointments  Date Time Provider Department Center  07/06/2024  1:10 PM Caudle, Thersia Bitters, FNP [facility] [phone] [facility]

## 2023-11-01 ENCOUNTER — Telehealth: Payer: Self-pay

## 2023-11-01 ENCOUNTER — Encounter: Payer: Self-pay | Admitting: Internal Medicine

## 2023-11-01 ENCOUNTER — Ambulatory Visit (INDEPENDENT_AMBULATORY_CARE_PROVIDER_SITE_OTHER): Admitting: Internal Medicine

## 2023-11-01 ENCOUNTER — Telehealth: Payer: Self-pay | Admitting: Internal Medicine

## 2023-11-01 ENCOUNTER — Other Ambulatory Visit (HOSPITAL_COMMUNITY): Payer: Self-pay

## 2023-11-01 VITALS — BP 126/74 | HR 94 | Ht 68.0 in | Wt 217.0 lb

## 2023-11-01 DIAGNOSIS — E781 Pure hyperglyceridemia: Secondary | ICD-10-CM

## 2023-11-01 DIAGNOSIS — E1065 Type 1 diabetes mellitus with hyperglycemia: Secondary | ICD-10-CM

## 2023-11-01 DIAGNOSIS — R635 Abnormal weight gain: Secondary | ICD-10-CM

## 2023-11-01 LAB — POCT GLYCOSYLATED HEMOGLOBIN (HGB A1C): Hemoglobin A1C: 8.1 % — AB (ref 4.0–5.6)

## 2023-11-01 MED ORDER — TIRZEPATIDE-WEIGHT MANAGEMENT 2.5 MG/0.5ML ~~LOC~~ SOLN: 2.5000 mg | SUBCUTANEOUS | 0 refills | Status: DC

## 2023-11-01 MED ORDER — TIRZEPATIDE-WEIGHT MANAGEMENT 5 MG/0.5ML ~~LOC~~ SOLN: 5.0000 mg | SUBCUTANEOUS | 3 refills | Status: DC

## 2023-11-01 MED ORDER — INSULIN GLARGINE-YFGN 100 UNIT/ML ~~LOC~~ SOPN: 18.0000 [IU] | PEN_INJECTOR | Freq: Every day | SUBCUTANEOUS | 1 refills | Status: DC

## 2023-11-01 NOTE — Addendum Note (Signed)
 Addended by: SAM DONELL PARAS on: 11/01/2023 03:27 PM   Modules accepted: Orders

## 2023-11-01 NOTE — Telephone Encounter (Signed)
 Can you please start orders for tandem MOBI pump?    The patient will return for fasting glucose and C-peptide   Thanks

## 2023-11-01 NOTE — Telephone Encounter (Signed)
 Order has been placed.

## 2023-11-01 NOTE — Telephone Encounter (Signed)
 Lantus  needs PA

## 2023-11-01 NOTE — Patient Instructions (Addendum)
 Lantus  18 units once daily NovoLog  1 unit for every 12 g of carbohydrates Novolog  correctional insulin : ADD extra units on insulin  to your meal-time Novolog  dose if your blood sugars are higher than 165. Use the scale below to help guide you:   Blood sugar before meal Number of units to inject  Less than 165 0 unit  166 -  210 1 units  211 -  255 2 units  256 -  300 3 units  301 -  345 4 units  346 -  390 5 units  391 -  435 6 units    Start Zepbound  2.5 mg weekly for 4 weeks, then increase to 5 mg weekly     HOW TO TREAT LOW BLOOD SUGARS (Blood sugar LESS THAN 70 MG/DL) Please follow the RULE OF 15 for the treatment of hypoglycemia treatment (when your (blood sugars are less than 70 mg/dL)   STEP 1: Take 15 grams of carbohydrates when your blood sugar is low, which includes:  3-4 GLUCOSE TABS  OR 3-4 OZ OF JUICE OR REGULAR SODA OR ONE TUBE OF GLUCOSE GEL    STEP 2: RECHECK blood sugar in 15 MINUTES STEP 3: If your blood sugar is still low at the 15 minute recheck --> then, go back to STEP 1 and treat AGAIN with another 15 grams of carbohydrates.

## 2023-11-01 NOTE — Telephone Encounter (Signed)
 Pharmacy Patient Advocate Encounter   Received notification from Pt Calls Messages that prior authorization for Lantus  is required/requested.   Insurance verification completed.   The patient is insured through [facility] .   Per test claim:  GLARGINE YFGN, SEMGLEE  YFGN, OR TOUJEO  is preferred by the insurance.  If suggested medication is appropriate, Please send in a new RX and discontinue this one. If not, please advise as to why it's not appropriate so that we may request a Prior Authorization. Please note, some preferred medications may still require a PA.  If the suggested medications have not been trialed and there are no contraindications to their use, the PA will not be submitted, as it will not be approved.

## 2023-11-06 ENCOUNTER — Other Ambulatory Visit

## 2023-11-06 DIAGNOSIS — E1065 Type 1 diabetes mellitus with hyperglycemia: Secondary | ICD-10-CM | POA: Diagnosis not present

## 2023-11-07 ENCOUNTER — Ambulatory Visit: Payer: Self-pay | Admitting: Internal Medicine

## 2023-11-07 LAB — BASIC METABOLIC PANEL WITH GFR
BUN: 12 mg/dL (ref 7–25)
CO2: 26 mmol/L (ref 20–32)
Calcium: 9.2 mg/dL (ref 8.6–10.2)
Chloride: 103 mmol/L (ref 98–110)
Creat: 0.81 mg/dL (ref 0.50–0.96)
Glucose, Bld: 174 mg/dL — ABNORMAL HIGH (ref 65–99)
Potassium: 4.2 mmol/L (ref 3.5–5.3)
Sodium: 137 mmol/L (ref 135–146)
eGFR: 104 mL/min/1.73m2 (ref >=60)

## 2023-11-07 LAB — C-PEPTIDE: C-Peptide: 2.06 ng/mL (ref 0.80–3.85)

## 2023-11-12 ENCOUNTER — Encounter (HOSPITAL_BASED_OUTPATIENT_CLINIC_OR_DEPARTMENT_OTHER): Payer: BC Managed Care – PPO | Admitting: Family Medicine

## 2023-11-18 DIAGNOSIS — E1065 Type 1 diabetes mellitus with hyperglycemia: Secondary | ICD-10-CM | POA: Diagnosis not present

## 2023-11-21 DIAGNOSIS — R1012 Left upper quadrant pain: Secondary | ICD-10-CM | POA: Diagnosis not present

## 2023-11-21 DIAGNOSIS — Z3202 Encounter for pregnancy test, result negative: Secondary | ICD-10-CM | POA: Diagnosis not present

## 2023-11-21 DIAGNOSIS — N390 Urinary tract infection, site not specified: Secondary | ICD-10-CM | POA: Diagnosis not present

## 2023-11-21 DIAGNOSIS — R109 Unspecified abdominal pain: Secondary | ICD-10-CM | POA: Diagnosis not present

## 2023-12-01 ENCOUNTER — Other Ambulatory Visit: Payer: Self-pay | Admitting: Internal Medicine

## 2023-12-19 DIAGNOSIS — E109 Type 1 diabetes mellitus without complications: Secondary | ICD-10-CM | POA: Diagnosis not present

## 2023-12-19 DIAGNOSIS — R21 Rash and other nonspecific skin eruption: Secondary | ICD-10-CM | POA: Diagnosis not present

## 2024-01-10 ENCOUNTER — Other Ambulatory Visit: Payer: Self-pay

## 2024-01-10 ENCOUNTER — Encounter: Payer: Self-pay | Admitting: Internal Medicine

## 2024-01-10 DIAGNOSIS — E1065 Type 1 diabetes mellitus with hyperglycemia: Secondary | ICD-10-CM

## 2024-01-10 MED ORDER — DEXCOM G7 SENSOR MISC: 1.0000 | 3 refills | Status: AC

## 2024-01-29 ENCOUNTER — Encounter (HOSPITAL_BASED_OUTPATIENT_CLINIC_OR_DEPARTMENT_OTHER): Payer: Self-pay

## 2024-02-07 NOTE — Telephone Encounter (Signed)
 Patient was identified as falling into the [place] Measure - Diabetes.   Patient was: Appointment already scheduled for:  03/04/24 with endocrine.

## 2024-03-03 NOTE — Progress Notes (Unsigned)
 "  Virtual Visit via Video Note  I connected withNAME@on  03/03/2024 at  7:50 AM EST by a video enabled telemedicine application and verified that I am speaking with the correct person using two identifiers.   I discussed the limitations of evaluation and management by telemedicine and the availability of in person appointments. The patient expressed understanding and agreed to proceed.   -Location of the patient :Home -Location of the provider : Office -The names of all persons participating in the telemedicine service : Pt and myself          Name: Susan [name]  Age/ Sex: 24 y.o., female   MRN/ DOB: [id number], 2000/09/05     PCP: Knute Thersia Bitters, FNP   Reason for Endocrinology Evaluation: Type 1 Diabetes Mellitus  Initial Endocrine Consultative Visit: 06/09/2020     PATIENT IDENTIFIER: Susan [name] is a 24 y.o. female with a past medical history of T1DM. The patient has followed with Endocrinology clinic since 06/09/2020 for consultative assistance with management of her diabetes.     DIABETIC HISTORY:  Susan [name] was diagnosed with DM in 2020. Was initially on insulin  while in [place], per pt was taken off due to low A1c but restarted on insulin  during hospitalization 05/2020. Insulin  pump started 06/2020 . Her hemoglobin A1c has ranged from 10.1% in 2020, peaking at 10.3% in 2022.   On her initial visit she had an A1c of 10.3 % , we adjusted MDI regimen and was started on Omnipod in 06/2020  Insurance would not cover GLP-1 agonist due to T1DM  Switch from the OmniPod to the tandem MOBI November, 2025   IDIOPATHIC HIRSUTISM: Testosterone, 17-OH progesterone, TFT's , DHEAS and 24-hr urinary cortisol have come back all normal 09/2020  SUBJECTIVE:   During the last visit (05/27/2023): A1c 8.6%    Today (03/03/2024): Susan [name] is here for a follow up on diabetes management.  She checks her blood sugars multiple  times daily through CGM.  The patient has had  hypoglycemia. She is symptomatic at times.   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 During the visit the pump basal and bolus doses were reviewed including carb/insulin  rations and supplemental doses. The clinical list was updated. The glucose meter download was reviewed in detail to determine if the current pump settings are providing the best glycemic control without excessive hypoglycemia.  Pump and meter download:    Pump   Mobi Settings   Insulin  type   Novolog    Basal rate       0000 0.5              I:C ratio       0000 1:12                  Sensitivity       0000  50      Goal       0000  120            Type & Model of Pump: OmniPod Insulin  Type: Currently using NovoLog .  There is no height or weight on file to calculate BMI.  PUMP STATISTICS: Average BG: 120 Average Daily Carbs (g): 60 Average Total Daily Insulin : 16.30 Average Daily Basal: 9.89(61 %) Average Daily Bolus: 6.40 (39 %)   HOME DIABETES REGIMEN:  Novolog   Statin: no ACE-I/ARB: no Prior Diabetic Education: yes    CONTINUOUS GLUCOSE MONITORING RECORD INTERPRETATION    Dates of Recording: 1/1-1/14/2026  Sensor description:dexcom  Results statistics:   CGM use % of time 98  Average 120/29  Time in range 94%  % Time Above 180 4.2  % Time above 250 0  % Time Below target 1.6     Glycemic patterns summary: BGs are optimal throughout the day and night Hyperglycemic episodes N/A Hypoglycemic episodes occurred at variable times Overnight periods:  optimal      DIABETIC COMPLICATIONS: Microvascular complications:   Denies: CKD, neuropathy, retinopathy  Last Eye Exam: Completed 06/2020  Macrovascular complications:   Denies: CAD, CVA, PVD   HISTORY:  Past Medical History:   Past Medical History:  Diagnosis Date   Diabetes (HCC)    DKA (diabetic ketoacidosis) (HCC) 04/18/2020   DKA, type 1 (HCC) 12/03/2018   Past Surgical History: No past surgical history on file. Social History:  reports that she has never smoked. She has never used smokeless tobacco. She reports that she does not currently use alcohol. She reports that she does not use drugs. Family History:  Family History  Problem Relation Age of Onset   Diabetes Mother    Diabetes Father    Breast cancer Maternal Grandmother    Pancreatic cancer Maternal Grandmother    Colon cancer Neg Hx    Uterine cancer Neg Hx    Ovarian cancer Neg Hx      HOME MEDICATIONS: Allergies as of 03/04/2024   No Known Allergies      Medication List        Accurate as of March 03, 2024 12:53 PM. If you have any questions, ask your nurse or doctor.          amoxicillin -clavulanate 875-125 MG tablet Commonly known as: AUGMENTIN  Take 1 tablet by mouth 2 (two) times daily.   cetirizine 10 MG tablet Commonly known as: ZYRTEC Take 10 mg by mouth daily.   Dexcom G7 Sensor Misc 1 Device by Does not apply route as directed.   EPINEPHrine  0.3 mg/0.3 mL Soaj injection Commonly known as: EPI-PEN Inject 0.3 mg into the muscle as needed for anaphylaxis.   hydroquinone 4 % cream 1 application Externally Twice a day   insulin  glargine-yfgn 100 UNIT/ML Pen Commonly known as: SEMGLEE  Inject 18 Units into the skin daily.   Insulin  Pen Needle 32G X 4 MM Misc 1 Device by Does not apply route in the morning, at noon, in the evening, and at bedtime.   norethindrone  0.35 MG tablet Commonly known as: MICRONOR  Take 1 tablet (0.35 mg total) by mouth daily.   NovoLOG  100 UNIT/ML injection Generic drug: insulin  aspart Max daily 60 units   Omnipod 5 G7 Pods (Gen 5) Misc 1 each by Does not apply route every 3 (three) days.   Omnipod 5 DexG7G6 Pods Gen 5 Misc USE EVERY 3 DAYS   spironolactone 100  MG tablet Commonly known as: ALDACTONE Take 100 mg by mouth daily.   tirzepatide  5 MG/0.5ML injection vial Inject 5 mg into the skin once a week.   tirzepatide  2.5 MG/0.5ML injection vial Commonly known as: ZEPBOUND  Inject 2.5 mg into the skin once a week.         OBJECTIVE:   Vital Signs: There were no vitals taken for this visit.  Wt Readings from Last 3 Encounters:  11/01/23 217 lb (98.4 kg)  07/04/23 212 lb (96.2 kg)  05/27/23 222  lb 6.4 oz (100.9 kg)     Exam: General: Pt appears well  Neuro: MS is good with appropriate affect, pt is alert and Ox3    DM foot exam: 05/27/2023  The skin of the feet is intact without sores or ulcerations. The pedal pulses are 2+ on right and 2+ on left. The sensation is intact to a screening 5.07, 10 gram monofilament bilaterally    DATA REVIEWED:  Lab Results  Component Value Date   HGBA1C 8.1 (A) 11/01/2023   HGBA1C 8.8 (H) 06/05/2023   HGBA1C 8.6 (A) 05/27/2023    Latest Reference Range & Units 07/04/23 13:35  Sodium 134 - 144 mmol/L 136  Potassium 3.5 - 5.2 mmol/L 4.5  Chloride 96 - 106 mmol/L 101  CO2 20 - 29 mmol/L 20  Glucose 70 - 99 mg/dL 88  BUN 6 - 20 mg/dL 11  Creatinine 9.42 - 8.99 mg/dL 9.15  Calcium 8.7 - 89.7 mg/dL 9.9  BUN/Creatinine Ratio 9 - 23  13  eGFR >59 mL/min/1.73 99  Alkaline Phosphatase 44 - 121 IU/L 81  Albumin 4.0 - 5.0 g/dL 4.7  AST 0 - 40 IU/L 20  ALT 0 - 32 IU/L 12  Total Protein 6.0 - 8.5 g/dL 8.2  Total Bilirubin 0.0 - 1.2 mg/dL 0.4  Total CHOL/HDL Ratio 0.0 - 4.4 ratio 3.7  Cholesterol, Total 100 - 199 mg/dL 852  HDL Cholesterol >60 mg/dL 40  Triglycerides 0 - 850 mg/dL 75  VLDL Cholesterol Cal 5 - 40 mg/dL 15  LDL Chol Calc (NIH) 0 - 99 mg/dL 92      ASSESSMENT / PLAN / RECOMMENDATIONS:   1) Type 1 Diabetes Mellitus, improving glycemic control, Without complications - Goal A1c < 7.0%.     - This was not a virtual visit -GMI based on CGM download 6.2% -She currently  has the tandem pump, patient has been noted with hypoglycemia, we will decrease basal rate as below  -Patient encouraged to continue with entering carbohydrates with each meal  MEDICATIONS: NovoLog   Pump   Mobi Settings   Insulin  type   Novolog    Basal rate       0000 0.4              I:C ratio       0000 1:12                  Sensitivity       0000  50      Goal       0000  120         EDUCATION / INSTRUCTIONS: BG monitoring instructions: Patient is instructed to check her blood sugars 4 times a day, before meals and bedtime  Call [facility] if: BG persistently < 70  I reviewed the Rule of 15 for the treatment of hypoglycemia in detail with the patient. Literature supplied.   2) Diabetic complications:  Eye: Does not have known diabetic retinopathy.  Neuro/ Feet: Does not have known diabetic peripheral neuropathy .  Renal: Patient does not have known baseline CKD. She   is not on an ACEI/ARB at present.     3) Hypertriglyceridemia :  - This has been improving      F/U in 4 months    Signed electronically by: Stefano Redgie Butts, MD  [facility]  [facility] [address] [place] [place], [place] [address] Phone: [phone] FAX: [phone]   CC: Knute Rouse  Schuyler, FNP [address] [place] [address] Phone: [phone]  Fax: [phone]  Return to Endocrinology clinic as below: Future Appointments  Date Time Provider Department Center  03/04/2024  7:50 AM Rayn Shorb, Donell Cardinal, MD [facility] None  07/07/2024  1:10 PM Caudle, Thersia Schuyler, FNP [facility] [phone] [place]       "

## 2024-03-04 ENCOUNTER — Telehealth: Admitting: Internal Medicine

## 2024-03-04 ENCOUNTER — Telehealth: Payer: Self-pay | Admitting: Internal Medicine

## 2024-03-04 DIAGNOSIS — E1065 Type 1 diabetes mellitus with hyperglycemia: Secondary | ICD-10-CM

## 2024-03-04 MED ORDER — INSULIN ASPART 100 UNIT/ML IJ SOLN: INTRAMUSCULAR | 3 refills | Status: AC

## 2024-03-04 NOTE — Telephone Encounter (Signed)
 Please schedule the patient for an office visit to see me in 5 months    Thanks

## 2024-07-06 ENCOUNTER — Encounter (HOSPITAL_BASED_OUTPATIENT_CLINIC_OR_DEPARTMENT_OTHER): Admitting: Family Medicine

## 2024-07-07 ENCOUNTER — Encounter (HOSPITAL_BASED_OUTPATIENT_CLINIC_OR_DEPARTMENT_OTHER): Admitting: Family Medicine

## 2024-08-10 ENCOUNTER — Ambulatory Visit: Admitting: Internal Medicine
